# Patient Record
Sex: Male | Born: 1945 | Race: White | Hispanic: No | Marital: Married | Smoking: Current some day smoker
Health system: Southern US, Community
[De-identification: ages and names within clinical notes are randomized; demographics above are authoritative.]

## PROBLEM LIST (undated history)

## (undated) DIAGNOSIS — T4145XA Adverse effect of unspecified anesthetic, initial encounter: Secondary | ICD-10-CM

## (undated) DIAGNOSIS — K219 Gastro-esophageal reflux disease without esophagitis: Secondary | ICD-10-CM

## (undated) DIAGNOSIS — J301 Allergic rhinitis due to pollen: Secondary | ICD-10-CM

## (undated) DIAGNOSIS — I10 Essential (primary) hypertension: Secondary | ICD-10-CM

## (undated) DIAGNOSIS — T8859XA Other complications of anesthesia, initial encounter: Secondary | ICD-10-CM

## (undated) DIAGNOSIS — J302 Other seasonal allergic rhinitis: Secondary | ICD-10-CM

## (undated) DIAGNOSIS — H269 Unspecified cataract: Secondary | ICD-10-CM

## (undated) HISTORY — PX: CYST EXCISION: SHX5701

## (undated) HISTORY — PX: NOSE SURGERY: SHX723

## (undated) HISTORY — DX: Unspecified cataract: H26.9

## (undated) HISTORY — PX: APPENDECTOMY: SHX54

## (undated) HISTORY — DX: Allergic rhinitis due to pollen: J30.1

## (undated) HISTORY — PX: OTHER SURGICAL HISTORY: SHX169

## (undated) HISTORY — PX: CHOLECYSTECTOMY: SHX55

## (undated) HISTORY — PX: EYE SURGERY: SHX253

---

## 2007-04-26 ENCOUNTER — Other Ambulatory Visit: Payer: Self-pay

## 2007-04-27 ENCOUNTER — Inpatient Hospital Stay: Payer: Self-pay | Admitting: Surgery

## 2008-07-10 IMAGING — CT CT ABD-PELV W/ CM
1 of 2 series · 15 of 32 positions shown, 19 images · non-contrast
Comparison: none

REASON FOR EXAM: (1) abd pain; (2) abd pain
COMMENTS:   LMP: (Male)

[Series 2: abdomen · axial · 0.72mm/px · z∈[-108,+332]mm · 15 of 61 slices shown, 19 images]
[im 3/61  soft-tissue]
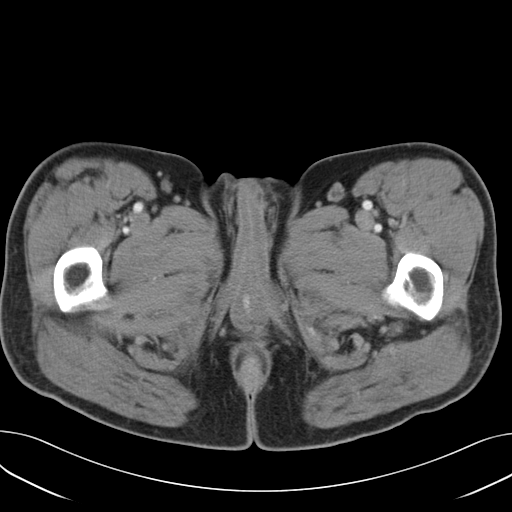
[im 3/61  bone]
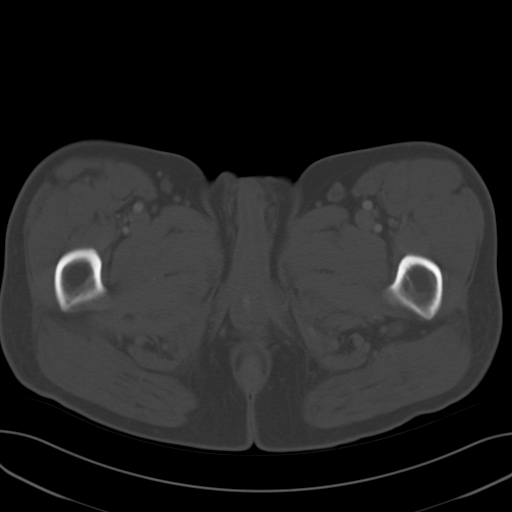
[im 7/61  soft-tissue]
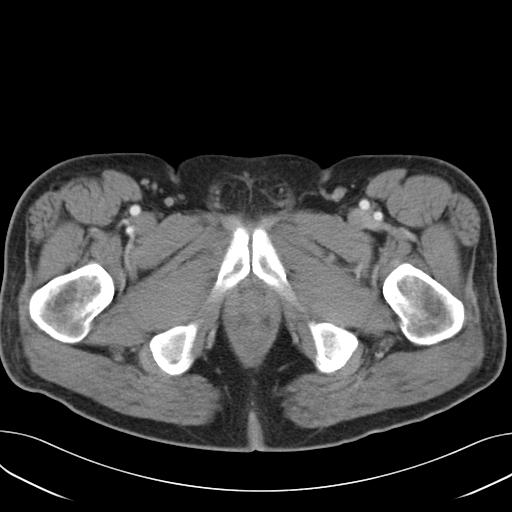
[im 12/61  soft-tissue]
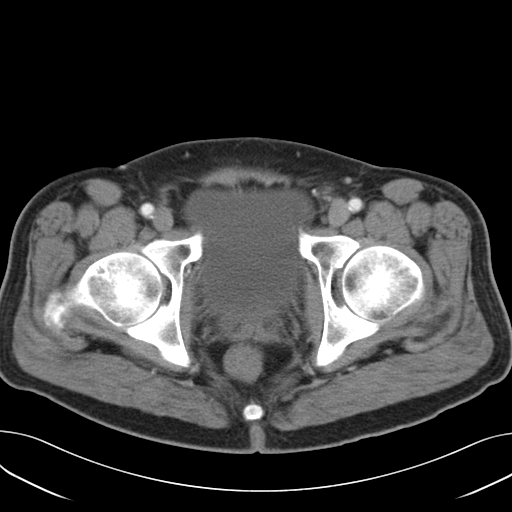
[im 17/61  soft-tissue]
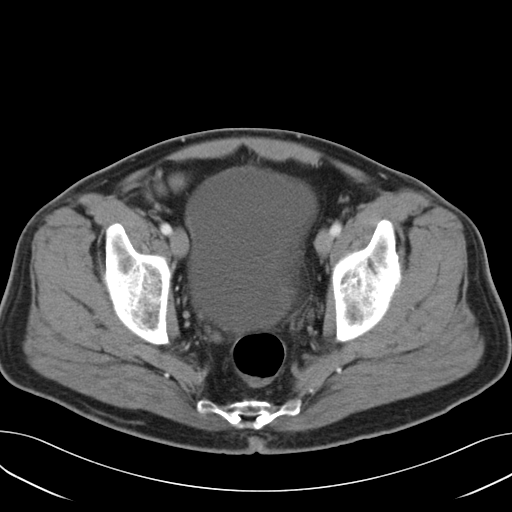
[im 21/61  soft-tissue]
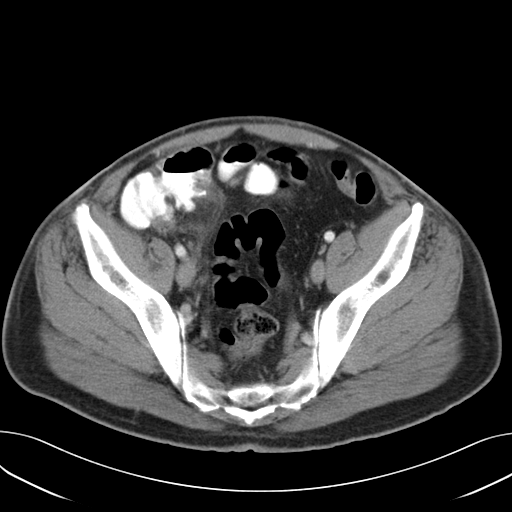
[im 26/61  soft-tissue]
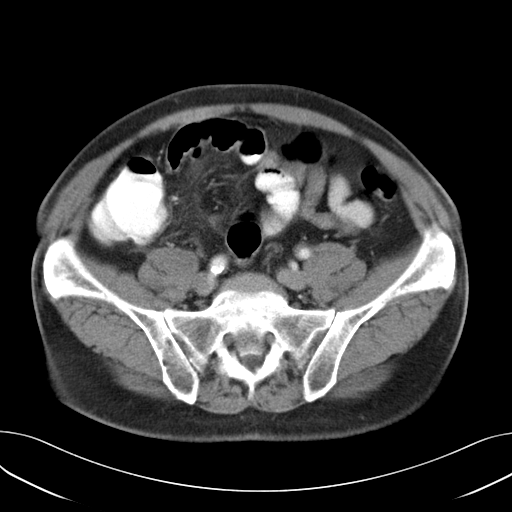
[im 30/61  soft-tissue]
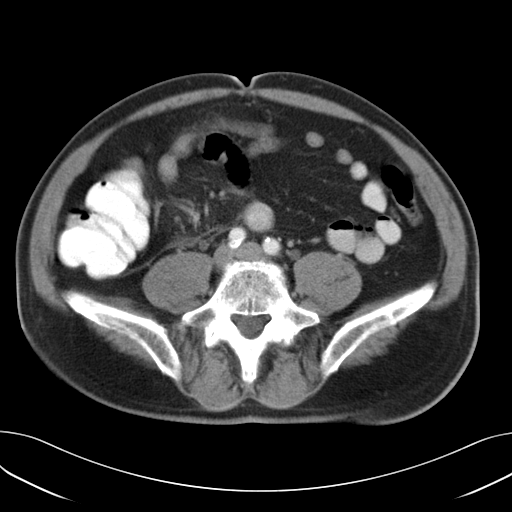
[im 35/61  soft-tissue]
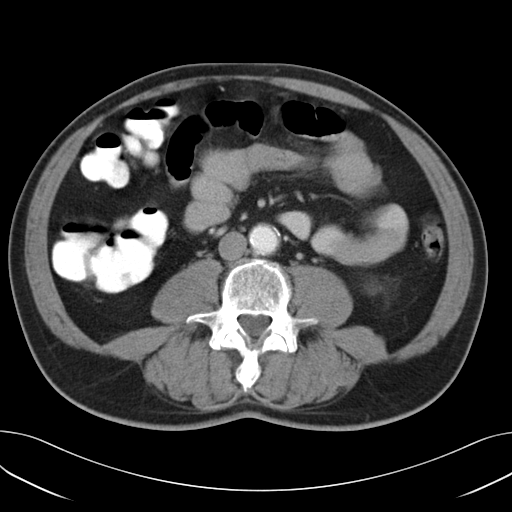
[im 40/61  soft-tissue]
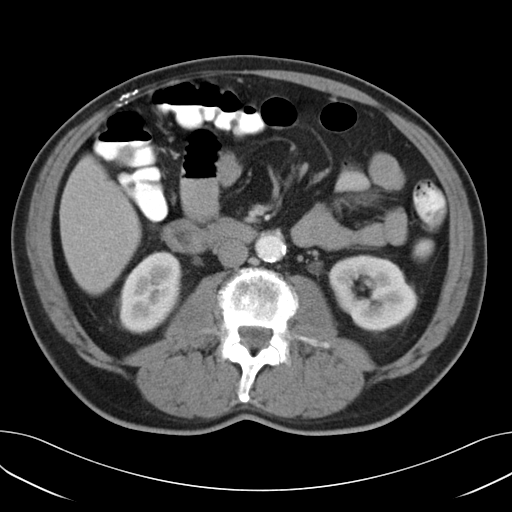
[im 40/61  bone]
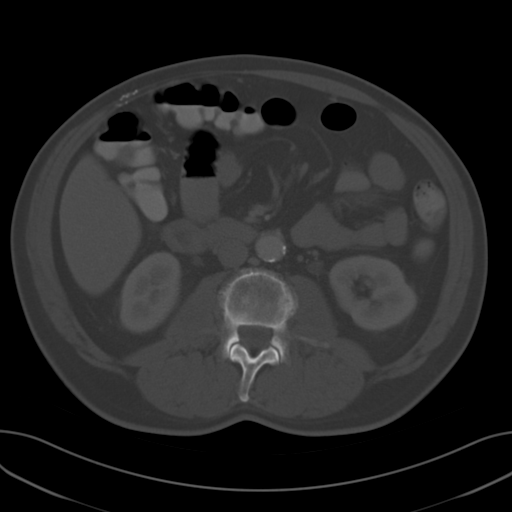
[im 44/61  soft-tissue]
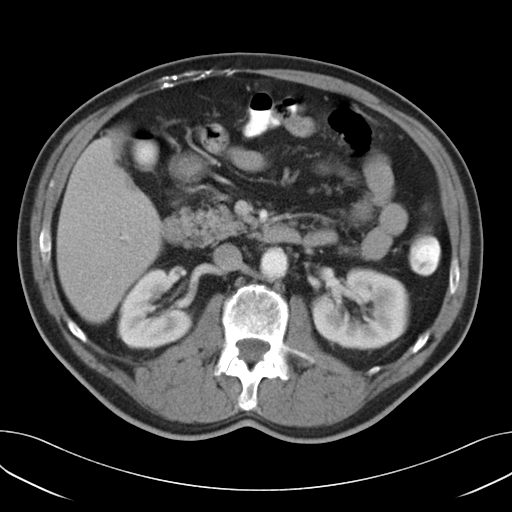
[im 49/61  soft-tissue]
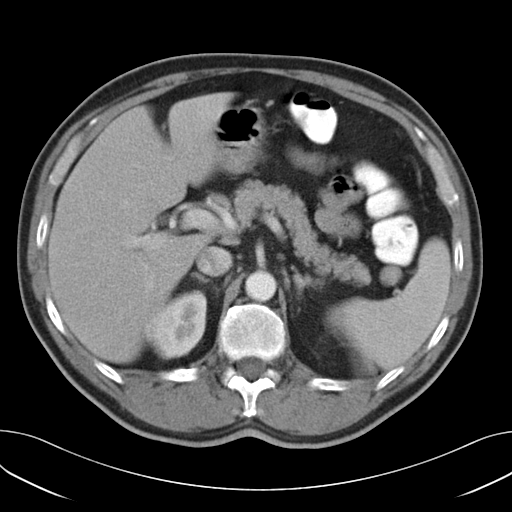
[im 51/61  lung]
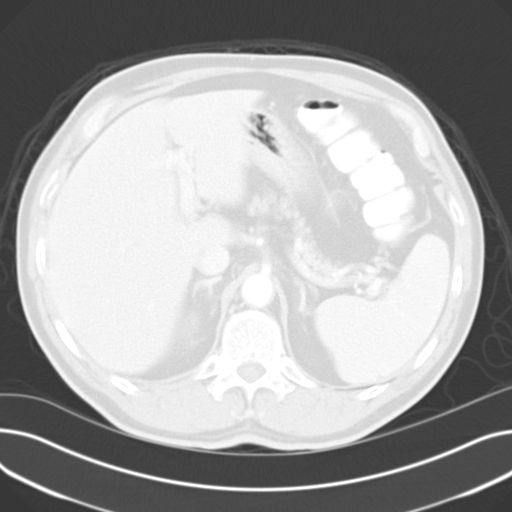
[im 54/61  soft-tissue]
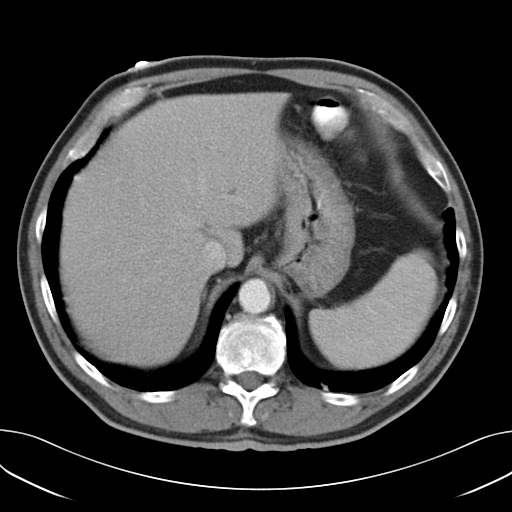
[im 54/61  lung]
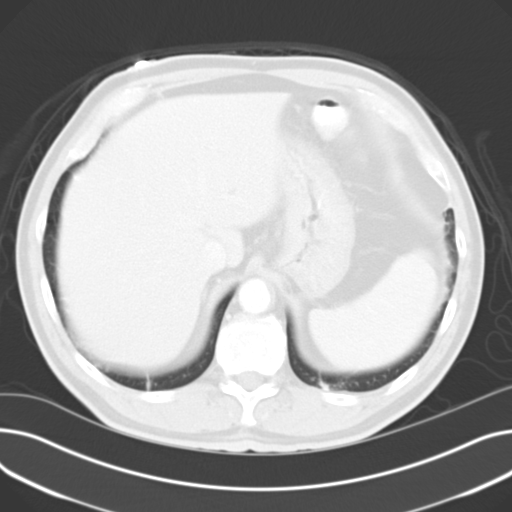
[im 56/61  lung]
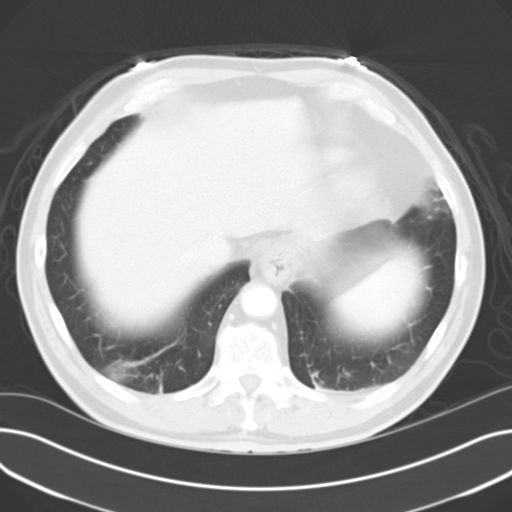
[im 58/61  soft-tissue]
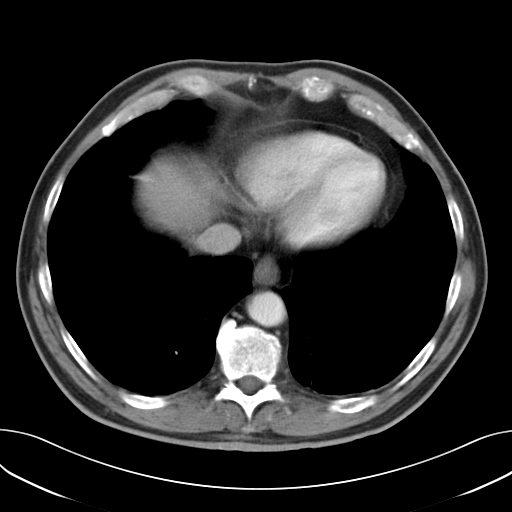
[im 58/61  lung]
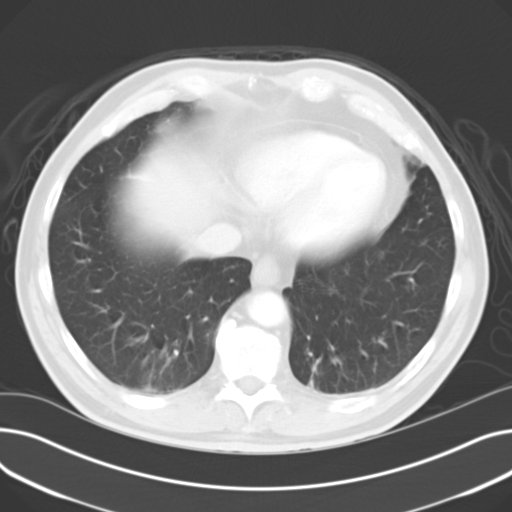

[15 of 32 positions shown; findings below may reference images not displayed]

PROCEDURE:     CT  - CT ABDOMEN / PELVIS  W  - April 26, 2007  [DATE]

RESULT:     Emergent CT of the abdomen and pelvis is performed utilizing 100
ml of Isovue-PK8 and oral contrast. The multislice helical data set is
reconstructed at 8 mm slice thickness. The patient has no prior exam for
comparison.

Study demonstrates significant stranding in the right lower quadrant with an
appearance suggestive of acute appendicitis. Surgical consultation is
recommended. There is no free air. There is no abscess. No significant free
fluid is seen. There is no abnormal bowel distention.

Atherosclerotic calcification is present within the aorta. There is no
aneurysm. The kidneys enhance normally and show no obstruction or mass.
There appears to be a rounded nodular density in the splenic hilar region
best seen on image 14 suggestive of this small accessory splenule. The
gallbladder has been removed. Some air-fluid levels are seen within loops of
small bowel but these are not suggestive of obstruction. Minimal low density
is seen in the left lobe the liver with this is nonspecific and too small
fracture characterization. No intrahepatic ductal dilatation is present. The
adrenal glands are unremarkable. The lung bases demonstrate minimal
atelectasis.
IMPRESSION: 1. Changes consistent with acute appendicitis.
2. Status post cholecystectomy.
3. Some bibasilar areas of atelectasis are present.

## 2009-07-30 ENCOUNTER — Ambulatory Visit: Payer: Self-pay | Admitting: Cardiovascular Disease

## 2009-07-30 ENCOUNTER — Ambulatory Visit: Payer: Self-pay | Admitting: Ophthalmology

## 2009-08-13 ENCOUNTER — Ambulatory Visit: Payer: Self-pay | Admitting: Ophthalmology

## 2010-04-19 ENCOUNTER — Ambulatory Visit: Payer: Self-pay | Admitting: Ophthalmology

## 2010-04-24 ENCOUNTER — Ambulatory Visit: Payer: Self-pay | Admitting: Ophthalmology

## 2010-10-18 ENCOUNTER — Ambulatory Visit: Payer: Self-pay

## 2012-01-02 IMAGING — US ABDOMEN ULTRASOUND LIMITED
1 series · 17 of 25 positions shown · non-contrast
Comparison: none

REASON FOR EXAM: elevated liver enzymes
COMMENTS:

[Series 1: abdomen ultrasound limited · 17 of 58 slices shown]
[im 1/58]
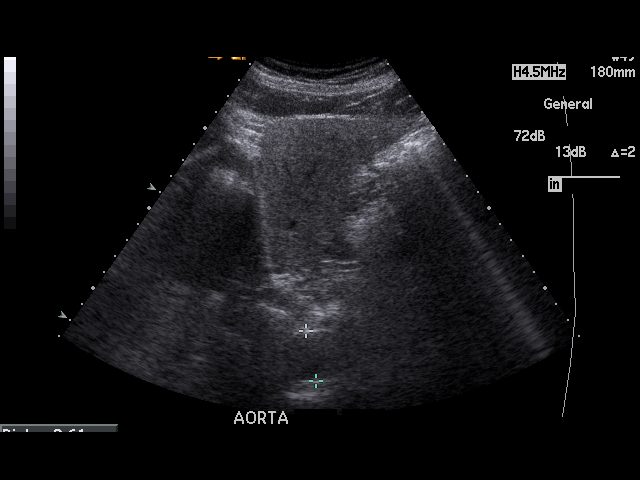
[im 5/58]
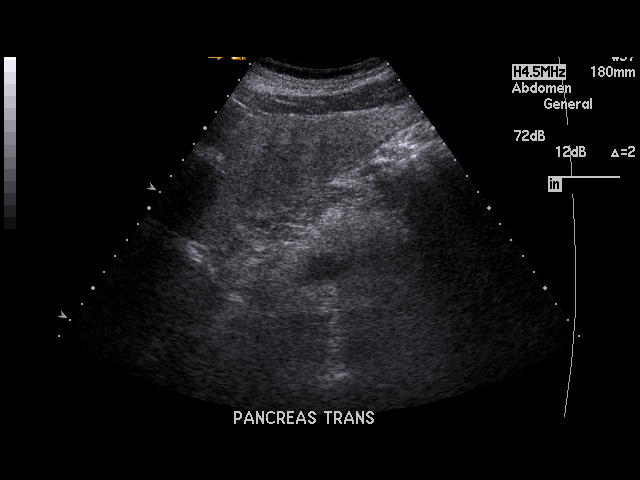
[im 8/58]
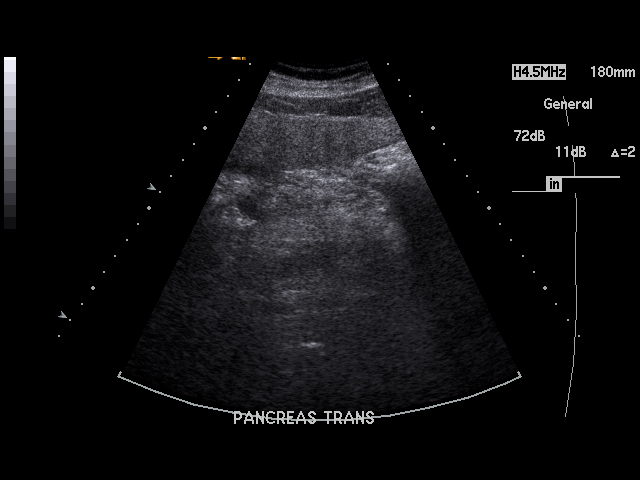
[im 12/58]
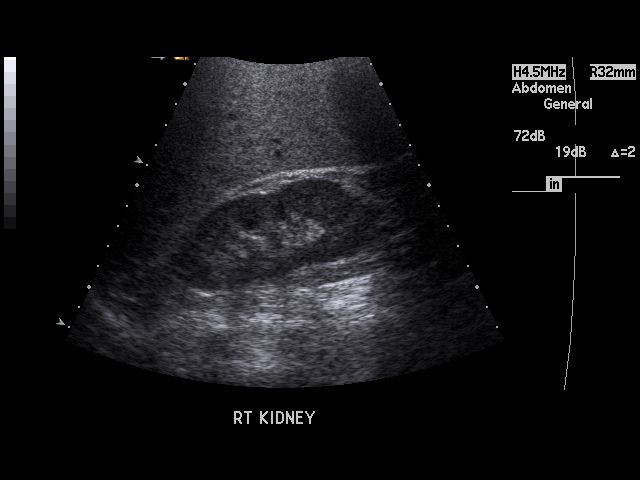
[im 15/58]
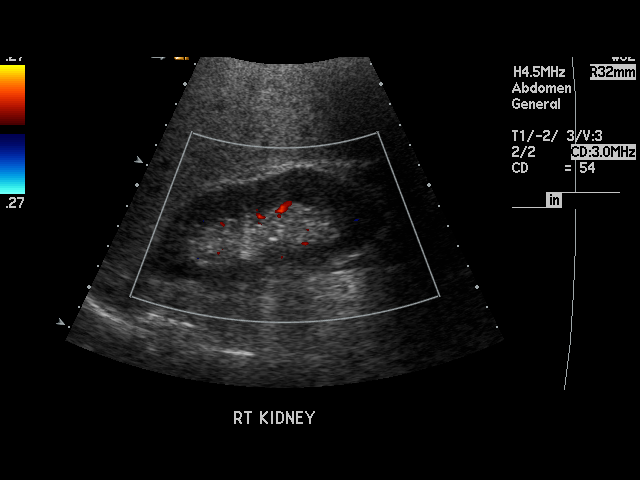
[im 20/58]
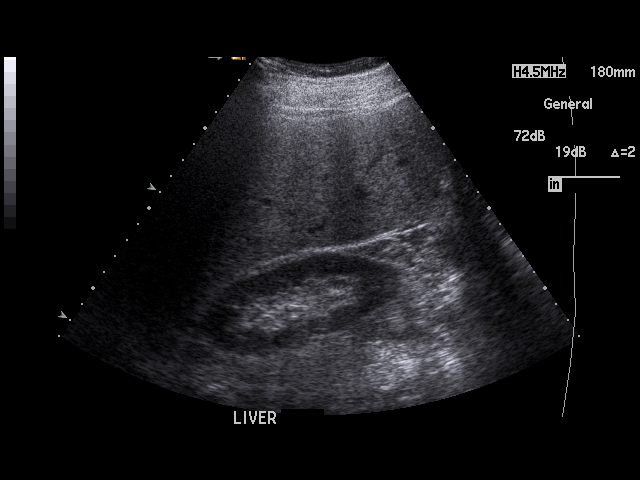
[im 22/58]
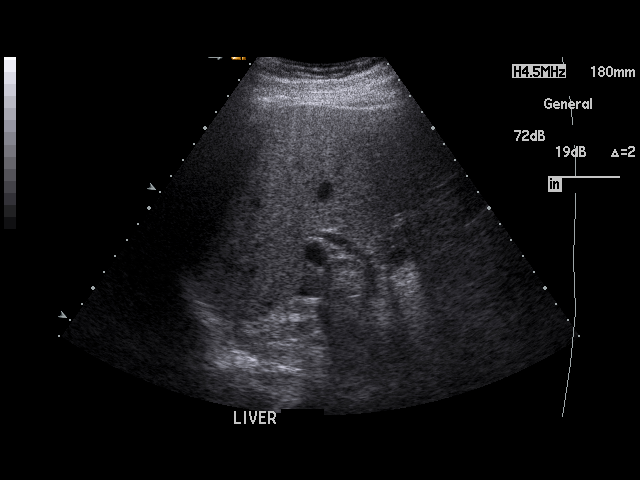
[im 27/58]
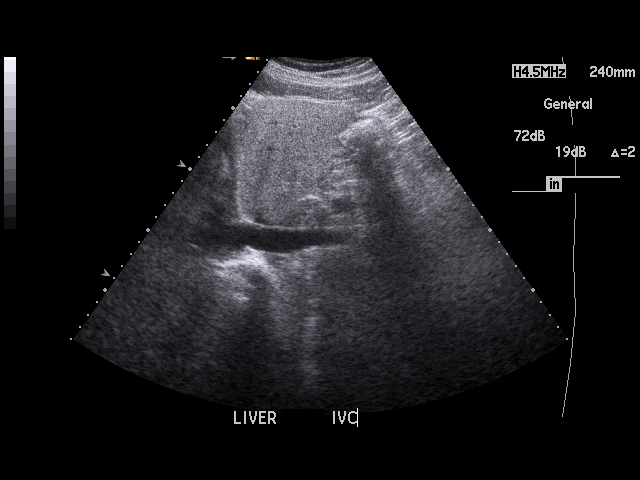
[im 29/58]
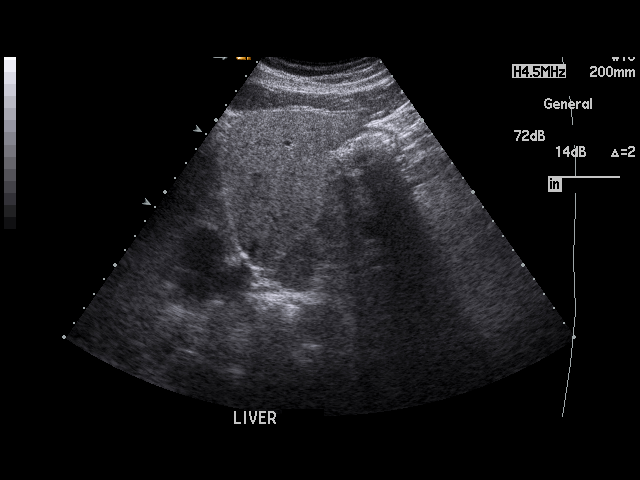
[im 31/58]
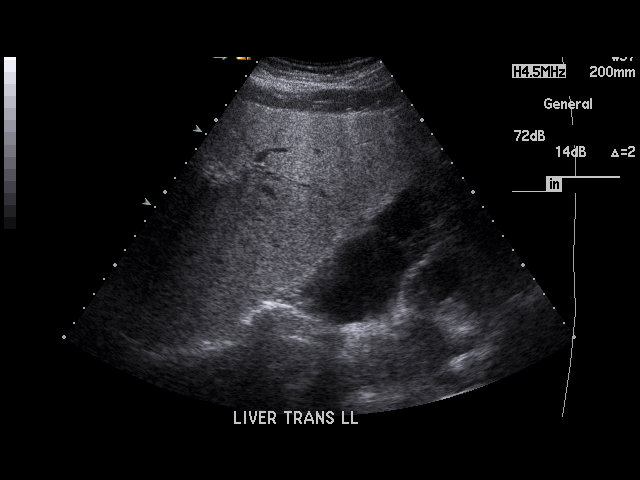
[im 36/58]
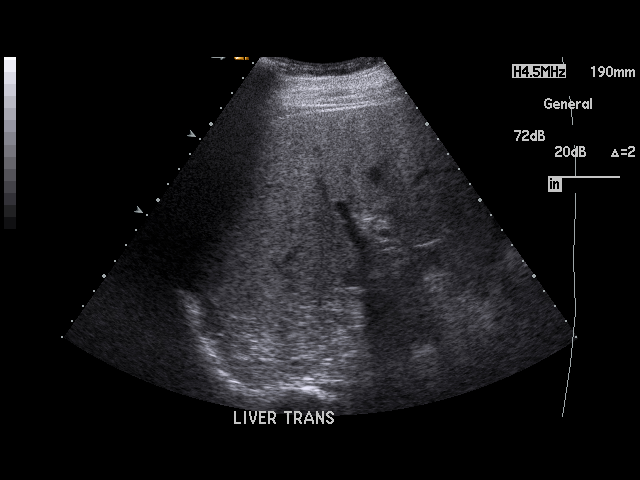
[im 39/58]
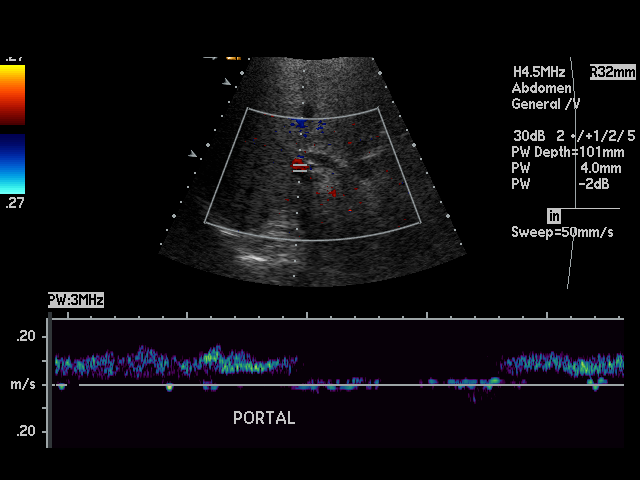
[im 43/58]
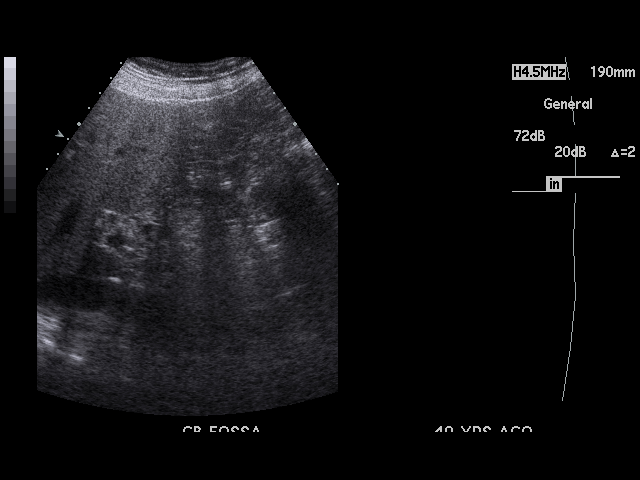
[im 46/58]
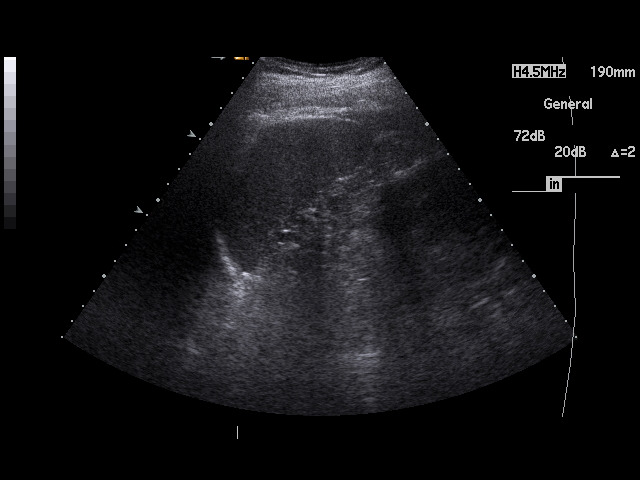
[im 50/58]
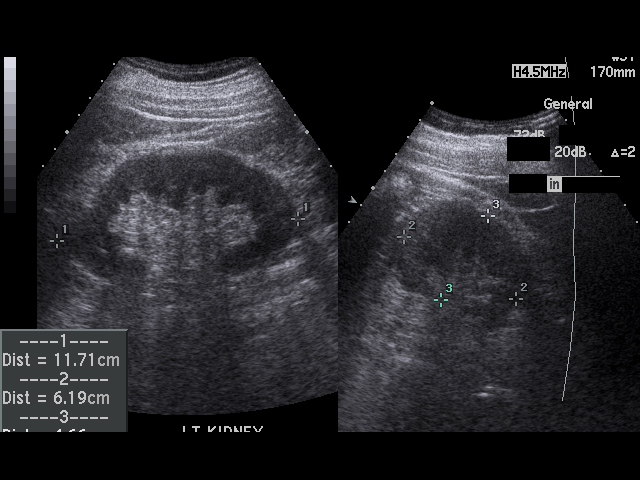
[im 53/58]
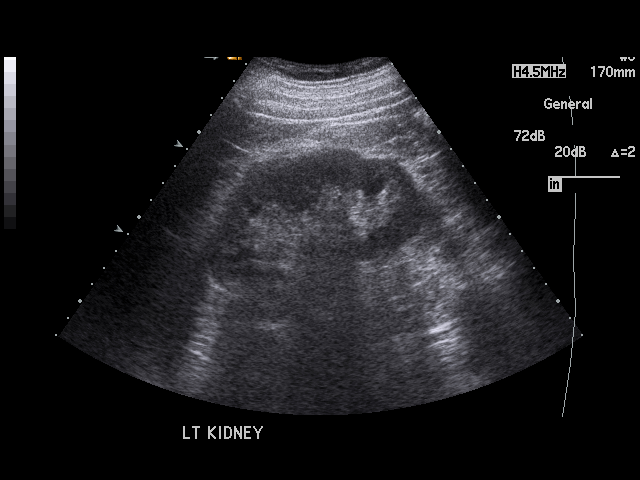
[im 58/58]
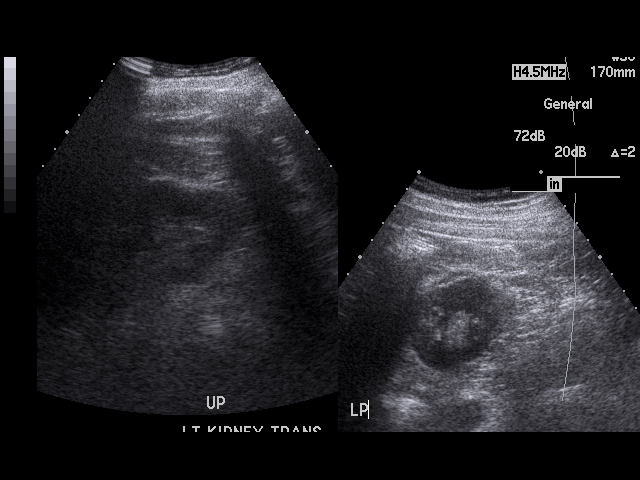

[17 of 25 positions shown; findings below may reference images not displayed]

PROCEDURE:     IFRAH - IFRAH ABDOMEN UPPER GENERAL  - October 18, 2010  [DATE]

RESULT:     The liver is slightly hyperechogenic, suspicious for fatty
infiltration. No focal hepatic mass lesions are seen. The spleen size is
normal. No significant abnormalities of the pancreas, abdominal aorta or
inferior vena cava are observed. The patient is status post cholecystectomy.
The common bile duct measures 6.1 mm in diameter which is within normal
limits. The kidneys show no hydronephrosis. Sagittally, the right kidney
measures 10.75 cm and the left measures 11.71 cm. No ascites is seen.
IMPRESSION: 1.  Probable fatty infiltration of the liver.
2.  The patient is status post cholecystectomy.
3.  No significant abnormalities are identified.

## 2012-03-05 DIAGNOSIS — E559 Vitamin D deficiency, unspecified: Secondary | ICD-10-CM | POA: Diagnosis not present

## 2012-03-05 DIAGNOSIS — I10 Essential (primary) hypertension: Secondary | ICD-10-CM | POA: Diagnosis not present

## 2012-03-05 DIAGNOSIS — E782 Mixed hyperlipidemia: Secondary | ICD-10-CM | POA: Diagnosis not present

## 2012-03-05 DIAGNOSIS — R7301 Impaired fasting glucose: Secondary | ICD-10-CM | POA: Diagnosis not present

## 2012-03-09 DIAGNOSIS — I10 Essential (primary) hypertension: Secondary | ICD-10-CM | POA: Diagnosis not present

## 2012-03-09 DIAGNOSIS — Z0000 Encounter for general adult medical examination without abnormal findings: Secondary | ICD-10-CM | POA: Diagnosis not present

## 2012-03-09 DIAGNOSIS — R7301 Impaired fasting glucose: Secondary | ICD-10-CM | POA: Diagnosis not present

## 2012-03-09 DIAGNOSIS — E559 Vitamin D deficiency, unspecified: Secondary | ICD-10-CM | POA: Diagnosis not present

## 2012-03-09 DIAGNOSIS — E782 Mixed hyperlipidemia: Secondary | ICD-10-CM | POA: Diagnosis not present

## 2012-07-09 DIAGNOSIS — H6120 Impacted cerumen, unspecified ear: Secondary | ICD-10-CM | POA: Diagnosis not present

## 2012-07-09 DIAGNOSIS — K219 Gastro-esophageal reflux disease without esophagitis: Secondary | ICD-10-CM | POA: Diagnosis not present

## 2012-11-17 DIAGNOSIS — H60509 Unspecified acute noninfective otitis externa, unspecified ear: Secondary | ICD-10-CM | POA: Diagnosis not present

## 2012-11-17 DIAGNOSIS — H6120 Impacted cerumen, unspecified ear: Secondary | ICD-10-CM | POA: Diagnosis not present

## 2013-01-07 DIAGNOSIS — K219 Gastro-esophageal reflux disease without esophagitis: Secondary | ICD-10-CM | POA: Diagnosis not present

## 2013-01-07 DIAGNOSIS — R5383 Other fatigue: Secondary | ICD-10-CM | POA: Diagnosis not present

## 2013-01-07 DIAGNOSIS — I10 Essential (primary) hypertension: Secondary | ICD-10-CM | POA: Diagnosis not present

## 2013-01-07 DIAGNOSIS — E782 Mixed hyperlipidemia: Secondary | ICD-10-CM | POA: Diagnosis not present

## 2013-03-14 DIAGNOSIS — Z0000 Encounter for general adult medical examination without abnormal findings: Secondary | ICD-10-CM | POA: Diagnosis not present

## 2013-03-14 DIAGNOSIS — E782 Mixed hyperlipidemia: Secondary | ICD-10-CM | POA: Diagnosis not present

## 2013-03-14 DIAGNOSIS — I10 Essential (primary) hypertension: Secondary | ICD-10-CM | POA: Diagnosis not present

## 2013-05-19 DIAGNOSIS — H6120 Impacted cerumen, unspecified ear: Secondary | ICD-10-CM | POA: Diagnosis not present

## 2013-05-19 DIAGNOSIS — H60509 Unspecified acute noninfective otitis externa, unspecified ear: Secondary | ICD-10-CM | POA: Diagnosis not present

## 2013-06-30 DIAGNOSIS — I10 Essential (primary) hypertension: Secondary | ICD-10-CM | POA: Diagnosis not present

## 2013-06-30 DIAGNOSIS — Z23 Encounter for immunization: Secondary | ICD-10-CM | POA: Diagnosis not present

## 2013-06-30 DIAGNOSIS — F411 Generalized anxiety disorder: Secondary | ICD-10-CM | POA: Diagnosis not present

## 2013-06-30 DIAGNOSIS — F17200 Nicotine dependence, unspecified, uncomplicated: Secondary | ICD-10-CM | POA: Diagnosis not present

## 2013-06-30 DIAGNOSIS — E782 Mixed hyperlipidemia: Secondary | ICD-10-CM | POA: Diagnosis not present

## 2013-06-30 DIAGNOSIS — K219 Gastro-esophageal reflux disease without esophagitis: Secondary | ICD-10-CM | POA: Diagnosis not present

## 2013-11-15 DIAGNOSIS — H6120 Impacted cerumen, unspecified ear: Secondary | ICD-10-CM | POA: Diagnosis not present

## 2013-11-15 DIAGNOSIS — H60509 Unspecified acute noninfective otitis externa, unspecified ear: Secondary | ICD-10-CM | POA: Diagnosis not present

## 2013-11-28 ENCOUNTER — Ambulatory Visit: Payer: Self-pay | Admitting: Anesthesiology

## 2013-11-28 DIAGNOSIS — Z01812 Encounter for preprocedural laboratory examination: Secondary | ICD-10-CM | POA: Diagnosis not present

## 2013-11-28 DIAGNOSIS — Z01810 Encounter for preprocedural cardiovascular examination: Secondary | ICD-10-CM | POA: Diagnosis not present

## 2013-11-28 DIAGNOSIS — I10 Essential (primary) hypertension: Secondary | ICD-10-CM | POA: Diagnosis not present

## 2013-11-28 LAB — BASIC METABOLIC PANEL
Anion Gap: 8 (ref 7–16)
BUN: 12 mg/dL (ref 7–18)
CALCIUM: 8.8 mg/dL (ref 8.5–10.1)
CO2: 23 mmol/L (ref 21–32)
CREATININE: 0.95 mg/dL (ref 0.60–1.30)
Chloride: 104 mmol/L (ref 98–107)
EGFR (African American): >60
EGFR (Non-African Amer.): >60
GLUCOSE: 212 mg/dL — AB (ref 65–99)
Osmolality: 276 (ref 275–301)
Potassium: 4.3 mmol/L (ref 3.5–5.1)
Sodium: 135 mmol/L — ABNORMAL LOW (ref 136–145)

## 2013-12-05 ENCOUNTER — Ambulatory Visit: Payer: Self-pay | Admitting: Otolaryngology

## 2013-12-05 DIAGNOSIS — J301 Allergic rhinitis due to pollen: Secondary | ICD-10-CM | POA: Diagnosis not present

## 2013-12-05 DIAGNOSIS — R222 Localized swelling, mass and lump, trunk: Secondary | ICD-10-CM | POA: Diagnosis not present

## 2013-12-05 DIAGNOSIS — I10 Essential (primary) hypertension: Secondary | ICD-10-CM | POA: Diagnosis not present

## 2013-12-05 DIAGNOSIS — R229 Localized swelling, mass and lump, unspecified: Secondary | ICD-10-CM | POA: Diagnosis not present

## 2013-12-05 DIAGNOSIS — L723 Sebaceous cyst: Secondary | ICD-10-CM | POA: Diagnosis not present

## 2013-12-05 DIAGNOSIS — L820 Inflamed seborrheic keratosis: Secondary | ICD-10-CM | POA: Diagnosis not present

## 2013-12-05 DIAGNOSIS — Z79899 Other long term (current) drug therapy: Secondary | ICD-10-CM | POA: Diagnosis not present

## 2013-12-07 LAB — PATHOLOGY REPORT

## 2013-12-30 DIAGNOSIS — K649 Unspecified hemorrhoids: Secondary | ICD-10-CM | POA: Diagnosis not present

## 2013-12-30 DIAGNOSIS — Z125 Encounter for screening for malignant neoplasm of prostate: Secondary | ICD-10-CM | POA: Diagnosis not present

## 2013-12-30 DIAGNOSIS — Z0000 Encounter for general adult medical examination without abnormal findings: Secondary | ICD-10-CM | POA: Diagnosis not present

## 2013-12-30 DIAGNOSIS — B356 Tinea cruris: Secondary | ICD-10-CM | POA: Diagnosis not present

## 2013-12-30 DIAGNOSIS — I10 Essential (primary) hypertension: Secondary | ICD-10-CM | POA: Diagnosis not present

## 2013-12-30 DIAGNOSIS — F17200 Nicotine dependence, unspecified, uncomplicated: Secondary | ICD-10-CM | POA: Diagnosis not present

## 2013-12-30 DIAGNOSIS — R7301 Impaired fasting glucose: Secondary | ICD-10-CM | POA: Diagnosis not present

## 2013-12-30 DIAGNOSIS — E782 Mixed hyperlipidemia: Secondary | ICD-10-CM | POA: Diagnosis not present

## 2014-03-22 DIAGNOSIS — Z125 Encounter for screening for malignant neoplasm of prostate: Secondary | ICD-10-CM | POA: Diagnosis not present

## 2014-03-22 DIAGNOSIS — E782 Mixed hyperlipidemia: Secondary | ICD-10-CM | POA: Diagnosis not present

## 2014-03-22 DIAGNOSIS — Z0100 Encounter for examination of eyes and vision without abnormal findings: Secondary | ICD-10-CM | POA: Diagnosis not present

## 2014-03-22 DIAGNOSIS — I10 Essential (primary) hypertension: Secondary | ICD-10-CM | POA: Diagnosis not present

## 2014-05-18 DIAGNOSIS — H6122 Impacted cerumen, left ear: Secondary | ICD-10-CM | POA: Diagnosis not present

## 2014-05-18 DIAGNOSIS — H6060 Unspecified chronic otitis externa, unspecified ear: Secondary | ICD-10-CM | POA: Diagnosis not present

## 2014-07-04 DIAGNOSIS — K219 Gastro-esophageal reflux disease without esophagitis: Secondary | ICD-10-CM | POA: Diagnosis not present

## 2014-07-04 DIAGNOSIS — E782 Mixed hyperlipidemia: Secondary | ICD-10-CM | POA: Diagnosis not present

## 2014-07-04 DIAGNOSIS — I10 Essential (primary) hypertension: Secondary | ICD-10-CM | POA: Diagnosis not present

## 2014-07-04 DIAGNOSIS — Z23 Encounter for immunization: Secondary | ICD-10-CM | POA: Diagnosis not present

## 2014-07-04 DIAGNOSIS — F17210 Nicotine dependence, cigarettes, uncomplicated: Secondary | ICD-10-CM | POA: Diagnosis not present

## 2014-07-19 DIAGNOSIS — J00 Acute nasopharyngitis [common cold]: Secondary | ICD-10-CM | POA: Diagnosis not present

## 2014-07-19 DIAGNOSIS — R070 Pain in throat: Secondary | ICD-10-CM | POA: Diagnosis not present

## 2014-11-16 DIAGNOSIS — J387 Other diseases of larynx: Secondary | ICD-10-CM | POA: Diagnosis not present

## 2014-11-16 DIAGNOSIS — H6123 Impacted cerumen, bilateral: Secondary | ICD-10-CM | POA: Diagnosis not present

## 2014-12-09 NOTE — Op Note (Signed)
PATIENT NAME:  Troy Baldwin, GEDNEY MR#:  [id number] DATE OF BIRTH:  November 07, 1945  DATE OF PROCEDURE:  12/05/2013  PREOPERATIVE DIAGNOSES:  1.  Symptomatic left upper eyelid mass.  2.  Left postauricular superior mass.  3.  Left inferior postauricular mass.  4.  Large symptomatic painful left chest wall mass.   PROCEDURE PERFORMED:  1.  Excision of the left upper eyelid lesion with simple closure.  2.  Excision of left posterior superior postauricular mass, 1 cm in size with simple closure.  3.  Excision of left posterior/inferior postauricular mass 1 cm in size with simple closure.  4.  Excision of left chest wall mass with multi-layered closure and advancement of adipose tissue for closure.   SURGEON: Jerene Bears, M.D.   ANESTHESIA: General endotracheal anesthesia.   ESTIMATED BLOOD LOSS: Less than 10 mL.   IV FLUIDS: Please see anesthesia record.   COMPLICATIONS: None.   DRAINS/STENT PLACEMENTS: None.   SPECIMENS: Left upper eyelid, left posterior and superior postauricular mass, left inferior postauricular mass and left chest wall mass sent for permanent pathological evaluation.   INDICATIONS FOR PROCEDURE: The patient is a 69 year old gentleman with a bleeding left upper eyelid lesion impacting his vision, left superior postauricular and inferior postauricular lesions as well as a painful groin and left chest wall mass.   DESCRIPTION OF PROCEDURE: The patient was identified in holding, benefits and risks of the procedure were discussed and consent was reviewed. The patient was taken to the operating room and placed in the supine position. Local anesthesia was employed with IV sedation and the patient's left upper eyelid as well as left postauricular area as well as left chest wall was anesthetized with 1% lidocaine with 1:100,000 epinephrine, 5 mL total. The patient was prepped and draped in sterile fashion. At this time, attention was directed to the patient's left upper eyelid. Under  direct visualization, the patient's left upper eyelid lesion was excised in an elliptical manner. Careful dissection with a 15 blade scalpel was made in the superficial layer. This was closed in a running fashion with 6-0 fast-absorbing plain gut. Attention at this time was directed to the patient's left superior postauricular mass and this was also excised in an elliptical fashion and care in his care was taken to ensure superficial pore of skin was excised and then this was dissected with curved iris scissors until the cyst was removed from the postauricular region and this was closed in a running fashion with 5-0 nylon.  Attention was directed to the patient's left inferior postauricular mass. In a similar fashion, it was excised in an elliptical fashion along the relaxed skin tension line and this was excised with the overlying skin and closed in an interrupted fashion using 5-0 nylon suture. At this time, attention was directed to the patient's large left-sided chest mass that was painful. There is previous scar where he had a cyst drained in the area. The previous scar was incorporated in the elliptical skin incision and a significant amount of scar tissue of the left chest wall mass was encountered. Using a combination of blunt and sharp techniques, this was circumferentially dissected down to fascia overlying the muscle and this was removed in its entirety and passed off the table for permanent pathological examination. Hemostasis was achieved using bipolar electrocautery. The patient's left chest wall cavity was copiously irrigated. Adipose tissue was dissected away from the underlying skin and advanced for filling of the large defect and then this was sutured  in place with 4-0 Vicryl and the subcutaneous tissue was reapproximated in an interrupted fashion using 4-0 Vicryl and then the skin was closed with a running 4-0 nylon. All the incision sites were dressed with Gentak ointment. Care of the patient,  at this time, was transferred to anesthesia.   ____________________________ Jerene Bears, MD ccv:aw D: 12/05/2013 10:56:32 ET T: 12/05/2013 11:13:08 ET JOB#: [id number]  cc: Jerene Bears, MD, <Dictator> Jerene Bears MD ELECTRONICALLY SIGNED 12/19/2013 9:29

## 2015-01-08 DIAGNOSIS — K219 Gastro-esophageal reflux disease without esophagitis: Secondary | ICD-10-CM | POA: Diagnosis not present

## 2015-01-08 DIAGNOSIS — I10 Essential (primary) hypertension: Secondary | ICD-10-CM | POA: Diagnosis not present

## 2015-01-08 DIAGNOSIS — Z0001 Encounter for general adult medical examination with abnormal findings: Secondary | ICD-10-CM | POA: Diagnosis not present

## 2015-01-08 DIAGNOSIS — R300 Dysuria: Secondary | ICD-10-CM | POA: Diagnosis not present

## 2015-01-08 DIAGNOSIS — E782 Mixed hyperlipidemia: Secondary | ICD-10-CM | POA: Diagnosis not present

## 2015-01-08 DIAGNOSIS — F17210 Nicotine dependence, cigarettes, uncomplicated: Secondary | ICD-10-CM | POA: Diagnosis not present

## 2015-04-03 DIAGNOSIS — E782 Mixed hyperlipidemia: Secondary | ICD-10-CM | POA: Diagnosis not present

## 2015-04-03 DIAGNOSIS — I10 Essential (primary) hypertension: Secondary | ICD-10-CM | POA: Diagnosis not present

## 2015-04-03 DIAGNOSIS — Z1159 Encounter for screening for other viral diseases: Secondary | ICD-10-CM | POA: Diagnosis not present

## 2015-04-03 DIAGNOSIS — Z0001 Encounter for general adult medical examination with abnormal findings: Secondary | ICD-10-CM | POA: Diagnosis not present

## 2015-05-18 DIAGNOSIS — H6063 Unspecified chronic otitis externa, bilateral: Secondary | ICD-10-CM | POA: Diagnosis not present

## 2015-05-18 DIAGNOSIS — H6123 Impacted cerumen, bilateral: Secondary | ICD-10-CM | POA: Diagnosis not present

## 2015-07-10 DIAGNOSIS — E782 Mixed hyperlipidemia: Secondary | ICD-10-CM | POA: Diagnosis not present

## 2015-07-10 DIAGNOSIS — F17210 Nicotine dependence, cigarettes, uncomplicated: Secondary | ICD-10-CM | POA: Diagnosis not present

## 2015-07-10 DIAGNOSIS — I10 Essential (primary) hypertension: Secondary | ICD-10-CM | POA: Diagnosis not present

## 2015-07-10 DIAGNOSIS — K219 Gastro-esophageal reflux disease without esophagitis: Secondary | ICD-10-CM | POA: Diagnosis not present

## 2015-07-23 DIAGNOSIS — H4311 Vitreous hemorrhage, right eye: Secondary | ICD-10-CM | POA: Diagnosis not present

## 2015-07-23 DIAGNOSIS — H43811 Vitreous degeneration, right eye: Secondary | ICD-10-CM | POA: Diagnosis not present

## 2015-07-30 DIAGNOSIS — H33011 Retinal detachment with single break, right eye: Secondary | ICD-10-CM | POA: Diagnosis not present

## 2015-08-01 ENCOUNTER — Ambulatory Visit
Admission: RE | Admit: 2015-08-01 | Discharge: 2015-08-01 | Disposition: A | Discharge status: Home / Self Care | Payer: Medicare Other | Source: Ambulatory Visit | Attending: Ophthalmology | Admitting: Ophthalmology

## 2015-08-01 ENCOUNTER — Ambulatory Visit: Payer: Medicare Other | Admitting: *Deleted

## 2015-08-01 ENCOUNTER — Encounter: Payer: Self-pay | Admitting: *Deleted

## 2015-08-01 ENCOUNTER — Encounter
Admission: RE | Disposition: A | Discharge status: Home / Self Care | Payer: Self-pay | Source: Ambulatory Visit | Attending: Ophthalmology

## 2015-08-01 DIAGNOSIS — F17200 Nicotine dependence, unspecified, uncomplicated: Secondary | ICD-10-CM | POA: Insufficient documentation

## 2015-08-01 DIAGNOSIS — H33001 Unspecified retinal detachment with retinal break, right eye: Secondary | ICD-10-CM | POA: Diagnosis not present

## 2015-08-01 DIAGNOSIS — Z79899 Other long term (current) drug therapy: Secondary | ICD-10-CM | POA: Diagnosis not present

## 2015-08-01 DIAGNOSIS — Z9849 Cataract extraction status, unspecified eye: Secondary | ICD-10-CM | POA: Insufficient documentation

## 2015-08-01 DIAGNOSIS — I10 Essential (primary) hypertension: Secondary | ICD-10-CM | POA: Insufficient documentation

## 2015-08-01 DIAGNOSIS — H33021 Retinal detachment with multiple breaks, right eye: Secondary | ICD-10-CM | POA: Diagnosis not present

## 2015-08-01 DIAGNOSIS — E7800 Pure hypercholesterolemia, unspecified: Secondary | ICD-10-CM | POA: Insufficient documentation

## 2015-08-01 DIAGNOSIS — H33011 Retinal detachment with single break, right eye: Secondary | ICD-10-CM | POA: Diagnosis not present

## 2015-08-01 DIAGNOSIS — Z9049 Acquired absence of other specified parts of digestive tract: Secondary | ICD-10-CM | POA: Insufficient documentation

## 2015-08-01 DIAGNOSIS — H3321 Serous retinal detachment, right eye: Secondary | ICD-10-CM | POA: Diagnosis present

## 2015-08-01 HISTORY — DX: Gastro-esophageal reflux disease without esophagitis: K21.9

## 2015-08-01 HISTORY — DX: Essential (primary) hypertension: I10

## 2015-08-01 HISTORY — PX: PARS PLANA VITRECTOMY: SHX2166

## 2015-08-01 SURGERY — PARS PLANA VITRECTOMY WITH 25 GAUGE
Anesthesia: Monitor Anesthesia Care | Laterality: Right

## 2015-08-01 MED ORDER — PHENYLEPHRINE HCL 10 % OP SOLN
1.0000 [drp] | Freq: Once | OPHTHALMIC | Status: AC
  Administered 2015-08-01: 1 [drp] via OPHTHALMIC

## 2015-08-01 MED ORDER — BSS PLUS IO SOLN
INTRAOCULAR | Status: DC | PRN
  Administered 2015-08-01: 1 via INTRAOCULAR

## 2015-08-01 MED ORDER — ATROPINE SULFATE 1 % OP SOLN
OPHTHALMIC | Status: AC
  Filled 2015-08-01: qty 5

## 2015-08-01 MED ORDER — PHENYLEPHRINE HCL 10 % OP SOLN
1.0000 [drp] | OPHTHALMIC | Status: AC
  Administered 2015-08-01 (×2): 1 [drp] via OPHTHALMIC

## 2015-08-01 MED ORDER — DEXAMETHASONE SODIUM PHOSPHATE 10 MG/ML IJ SOLN
INTRAMUSCULAR | Status: DC | PRN
  Administered 2015-08-01: .5 mL

## 2015-08-01 MED ORDER — BSS PLUS IO SOLN
Freq: Once | INTRAOCULAR | Status: DC
  Filled 2015-08-01: qty 500

## 2015-08-01 MED ORDER — BUPIVACAINE HCL (PF) 0.75 % IJ SOLN
INTRAMUSCULAR | Status: AC
  Filled 2015-08-01: qty 10

## 2015-08-01 MED ORDER — CEFUROXIME OPHTHALMIC INJECTION 1 MG/0.1 ML
INJECTION | OPHTHALMIC | Status: DC | PRN
  Administered 2015-08-01: 0.1 mL via INTRACAMERAL

## 2015-08-01 MED ORDER — TRIAMCINOLONE ACETONIDE 40 MG/ML IJ SUSP
INTRAMUSCULAR | Status: AC
  Filled 2015-08-01: qty 1

## 2015-08-01 MED ORDER — CYCLOPENTOLATE HCL 2 % OP SOLN
1.0000 [drp] | OPHTHALMIC | Status: DC | PRN
  Administered 2015-08-01 (×2): 1 [drp] via OPHTHALMIC

## 2015-08-01 MED ORDER — LIDOCAINE HCL (PF) 4 % IJ SOLN
INTRAMUSCULAR | Status: DC | PRN
  Administered 2015-08-01: 6 mL via OPHTHALMIC

## 2015-08-01 MED ORDER — ONDANSETRON HCL 4 MG/2ML IJ SOLN: 4.0000 mg | Freq: Once | INTRAMUSCULAR | Status: DC | PRN

## 2015-08-01 MED ORDER — DEXAMETHASONE SODIUM PHOSPHATE 10 MG/ML IJ SOLN
INTRAMUSCULAR | Status: AC
  Filled 2015-08-01: qty 1

## 2015-08-01 MED ORDER — HYPROMELLOSE 0.3 % OP GEL
OPHTHALMIC | Status: AC
  Filled 2015-08-01: qty 3.5

## 2015-08-01 MED ORDER — MIDAZOLAM HCL 2 MG/2ML IJ SOLN
INTRAMUSCULAR | Status: DC | PRN
  Administered 2015-08-01 (×2): 1 mg via INTRAVENOUS

## 2015-08-01 MED ORDER — CYCLOPENTOLATE HCL 2 % OP SOLN
1.0000 [drp] | OPHTHALMIC | Status: AC | PRN
  Administered 2015-08-01: 1 [drp] via OPHTHALMIC

## 2015-08-01 MED ORDER — TETRACAINE HCL 0.5 % OP SOLN
OPHTHALMIC | Status: DC | PRN
  Administered 2015-08-01: 2 [drp] via OPHTHALMIC

## 2015-08-01 MED ORDER — SODIUM CHLORIDE 0.9 % IV SOLN
INTRAVENOUS | Status: DC
  Administered 2015-08-01 (×2): via INTRAVENOUS

## 2015-08-01 MED ORDER — CYCLOPENTOLATE HCL 2 % OP SOLN
OPHTHALMIC | Status: AC
  Administered 2015-08-01: 1 [drp] via OPHTHALMIC
  Filled 2015-08-01: qty 2

## 2015-08-01 MED ORDER — ALFENTANIL 500 MCG/ML IJ INJ
INJECTION | INTRAMUSCULAR | Status: DC | PRN
  Administered 2015-08-01 (×2): 500 ug via INTRAVENOUS

## 2015-08-01 MED ORDER — NEOMYCIN-POLYMYXIN-DEXAMETH 3.5-10000-0.1 OP OINT
TOPICAL_OINTMENT | OPHTHALMIC | Status: AC
  Filled 2015-08-01: qty 3.5

## 2015-08-01 MED ORDER — HYPROMELLOSE 0.3 % OP GEL
OPHTHALMIC | Status: DC | PRN
  Administered 2015-08-01: 1 via OPHTHALMIC

## 2015-08-01 MED ORDER — PHENYLEPHRINE HCL 10 % OP SOLN
OPHTHALMIC | Status: AC
  Administered 2015-08-01: 1 [drp] via OPHTHALMIC
  Filled 2015-08-01: qty 5

## 2015-08-01 MED ORDER — NEOMYCIN-POLYMYXIN-DEXAMETH 0.1 % OP OINT
TOPICAL_OINTMENT | OPHTHALMIC | Status: DC | PRN
  Administered 2015-08-01: 1 via OPHTHALMIC

## 2015-08-01 MED ORDER — LIDOCAINE HCL (PF) 4 % IJ SOLN
INTRAMUSCULAR | Status: AC
  Filled 2015-08-01: qty 5

## 2015-08-01 MED ORDER — ATROPINE SULFATE 1 % OP SOLN
OPHTHALMIC | Status: DC | PRN
  Administered 2015-08-01: 2 [drp] via OPHTHALMIC

## 2015-08-01 MED ORDER — TETRACAINE HCL 0.5 % OP SOLN
OPHTHALMIC | Status: AC
  Filled 2015-08-01: qty 2

## 2015-08-01 MED ORDER — HYALURONIDASE HUMAN 150 UNIT/ML IJ SOLN
INTRAMUSCULAR | Status: AC
  Filled 2015-08-01: qty 1

## 2015-08-01 MED ORDER — HYDROMORPHONE HCL 1 MG/ML IJ SOLN: 0.2500 mg | INTRAMUSCULAR | Status: DC | PRN

## 2015-08-01 MED ORDER — CEFUROXIME OPHTHALMIC INJECTION 1 MG/0.1 ML
INJECTION | OPHTHALMIC | Status: AC
  Filled 2015-08-01: qty 0.1

## 2015-08-01 SURGICAL SUPPLY — 32 items
APPLICATOR COTTON TIP 6IN STRL (MISCELLANEOUS) ×8 IMPLANT
CANNULA SOFT TIP 25G (CANNULA) IMPLANT
CORD BIP STRL DISP 12FT (MISCELLANEOUS) IMPLANT
CUP MEDICINE 2OZ PLAST GRAD ST (MISCELLANEOUS) ×2 IMPLANT
ERASER HMR WETFIELD 25G (MISCELLANEOUS) ×2 IMPLANT
FILTER MILLEX .045 (MISCELLANEOUS) ×1 IMPLANT
FLTR MILLEX .045 (MISCELLANEOUS) ×2
FORCEPS GRIESH GRASP 25G (INSTRUMENTS) IMPLANT
FORCEPS GRIESH ILM PLUS 25G (INSTRUMENTS) IMPLANT
GLOVE BIO SURGEON STRL SZ8 (GLOVE) ×2 IMPLANT
GLOVE SURG LX 6.5 MICRO (GLOVE) ×1
GLOVE SURG LX STRL 6.5 MICRO (GLOVE) ×1 IMPLANT
GLOVE SURG PR MICRO ENCORE 7 (GLOVE) ×2 IMPLANT
GOWN STRL REUS W/ TWL LRG LVL3 (GOWN DISPOSABLE) ×2 IMPLANT
GOWN STRL REUS W/TWL LRG LVL3 (GOWN DISPOSABLE) ×2
IV SET STOPCOCK EXT 40 2IN (SET/KITS/TRAYS/PACK) ×2 IMPLANT
LENS BIOM OPTIC SET 200MM DISP (MISCELLANEOUS) ×2 IMPLANT
LENS VITRECTOMY FLAT DISP (MISCELLANEOUS) IMPLANT
NDL RETROBULBAR .5 NSTRL (NEEDLE) ×2 IMPLANT
NEEDLE FILTER BLUNT 18X 1/2SAF (NEEDLE) ×1
NEEDLE FILTER BLUNT 18X1 1/2 (NEEDLE) ×1 IMPLANT
PACK EYE AFTER SURG (MISCELLANEOUS) ×2 IMPLANT
PACK VITRECTOMY (MISCELLANEOUS) ×2 IMPLANT
PACK VITRECTOMY CASSETTE 25GA (MISCELLANEOUS) ×2 IMPLANT
PROBE DIRECTIONAL LASER (MISCELLANEOUS) IMPLANT
PROBE LASER ILLUM FLEX CVD 25G (OPHTHALMIC) ×2 IMPLANT
SOL PREP PVP 2OZ (MISCELLANEOUS) ×2
SOLUTION PREP PVP 2OZ (MISCELLANEOUS) ×1 IMPLANT
STRAP SAFETY BODY (MISCELLANEOUS) ×2 IMPLANT
SUT VICRYL 7 0 TG140 8 (SUTURE) ×2 IMPLANT
SYR 50ML LL SCALE MARK (SYRINGE) ×2 IMPLANT
SYRINGE 10CC LL (SYRINGE) ×2 IMPLANT

## 2015-08-01 NOTE — Anesthesia Postprocedure Evaluation (Signed)
Anesthesia Post Note  Patient: Troy Baldwin  Procedure(s) Performed: Procedure(s) (LRB): PARS PLANA VITRECTOMY WITH 25 GAUGE,endolaser,gas exchange (Right)  Patient location during evaluation: PACU Anesthesia Type: MAC Level of consciousness: awake and alert Vital Signs Assessment: post-procedure vital signs reviewed and stable Respiratory status: spontaneous breathing Cardiovascular status: stable Postop Assessment: no headache    Last Vitals:  Filed Vitals:   08/01/15 0615 08/01/15 0909  BP: 151/73 128/65  Pulse: 58 54  Temp: 36.5 C 36.1 C  Resp: 20 18    Last Pain: There were no vitals filed for this visit.               Buckner Malta

## 2015-08-01 NOTE — Discharge Instructions (Signed)
FOLLOW DR. Roderfield'S POSTOP DISCHARGE INSTRUCTION SHEET AS REVIEWED\ ° °AMBULATORY SURGERY  °DISCHARGE INSTRUCTIONS ° ° °1) The drugs that you were given will stay in your system until tomorrow so for the next 24 hours you should not: ° °A) Drive an automobile °B) Make any legal decisions °C) Drink any alcoholic beverage ° ° °2) You may resume regular meals tomorrow.  Today it is better to start with liquids and gradually work up to solid foods. ° °You may eat anything you prefer, but it is better to start with liquids, then soup and crackers, and gradually work up to solid foods. ° ° °3) Please notify your doctor immediately if you have any unusual bleeding, trouble breathing, redness and pain at the surgery site, drainage, fever, or pain not relieved by medication. ° ° ° °4) Additional Instructions: ° ° ° ° ° ° ° °Please contact your physician with any problems or Same Day Surgery at [phone], Monday through Friday 6 am to 4 pm, or [place] [address] [place] [facility] number at [phone]. °

## 2015-08-01 NOTE — Anesthesia Postprocedure Evaluation (Signed)
Anesthesia Post Note  Patient: Pranay Panas  Procedure(s) Performed: Procedure(s) (LRB): PARS PLANA VITRECTOMY WITH 25 GAUGE,endolaser,gas exchange (Right)  Patient location during evaluation: Short Stay Anesthesia Type: MAC Level of consciousness: awake and alert Pain management: pain level controlled Vital Signs Assessment: post-procedure vital signs reviewed and stable Respiratory status: spontaneous breathing Cardiovascular status: blood pressure returned to baseline Postop Assessment: no headache Anesthetic complications: no    Last Vitals:  Filed Vitals:   08/01/15 0909 08/01/15 0917  BP: 128/65 115/67  Pulse: 54 50  Temp: 36.1 C   Resp: 18 18    Last Pain: There were no vitals filed for this visit.               Mellony Danziger M

## 2015-08-01 NOTE — Anesthesia Procedure Notes (Signed)
Date/Time: 08/01/2015 7:45 AM Performed by: Allean Found Pre-anesthesia Checklist: Patient identified, Emergency Drugs available, Suction available, Patient being monitored and Timeout performed Patient Re-evaluated:Patient Re-evaluated prior to inductionOxygen Delivery Method: Nasal cannula Intubation Type: IV induction Placement Confirmation: positive ETCO2 Comments: [place] MAC

## 2015-08-01 NOTE — H&P (Signed)
.  Previous H&P scanned in reviewed, patient examined, and no interval changes.  Please see scanned record for complete information.   

## 2015-08-01 NOTE — Transfer of Care (Signed)
Immediate Anesthesia Transfer of Care Note  Patient: Troy Baldwin  Procedure(s) Performed: Procedure(s) with comments: PARS PLANA VITRECTOMY WITH 25 GAUGE,endolaser,gas exchange (Right) - casette lot [id number]      28% SF6  Patient Location: PACU       Anesthesia Type:MAC   Level of Consciousness: awake   Airway & Oxygen Therapy: Patient Spontanous Breathing  Post-op Assessment: Report given to RN and Post -op Vital signs reviewed and stable  Post vital signs: Reviewed and stable  Last Vitals:  Filed Vitals:   08/01/15 0615 08/01/15 0909  BP: 151/73 128/65  Pulse: 58 54  Temp: 36.5 C 36.1 C  Resp: 20 18    Complications: No apparent anesthesia complications

## 2015-08-01 NOTE — Anesthesia Preprocedure Evaluation (Signed)
Anesthesia Evaluation  Patient identified by MRN, date of birth, ID band Patient awake    Reviewed: Allergy & Precautions, NPO status , Patient's Chart, lab work & pertinent test results  Airway Mallampati: III  TM Distance: >3 FB Neck ROM: Limited    Dental  (+) Teeth Intact   Pulmonary Current Smoker,    Pulmonary exam normal        Cardiovascular Exercise Tolerance: Good hypertension, Pt. on medications and Pt. on home beta blockers Normal cardiovascular exam     Neuro/Psych    GI/Hepatic GERD  Medicated and Controlled,  Endo/Other    Renal/GU      Musculoskeletal   Abdominal Normal abdominal exam  (+)   Peds  Hematology   Anesthesia Other Findings   Reproductive/Obstetrics                             Anesthesia Physical Anesthesia Plan  ASA: II  Anesthesia Plan: MAC   Post-op Pain Management:    Induction: Intravenous  Airway Management Planned: Nasal Cannula  Additional Equipment:   Intra-op Plan:   Post-operative Plan:   Informed Consent: I have reviewed the patients History and Physical, chart, labs and discussed the procedure including the risks, benefits and alternatives for the proposed anesthesia with the patient or authorized representative who has indicated his/her understanding and acceptance.     Plan Discussed with: CRNA  Anesthesia Plan Comments: (MAC plus block by surgeon.)        Anesthesia Quick Evaluation

## 2015-08-01 NOTE — Op Note (Signed)
.  INDICATIONS & JUSTIFICATIONS FOR SURGERY: Macula on retinal detachment right eye   PREOPERATIVE DIAGNOSIS: Retinal detachment, right eye.             POST OPERATIVE DIAGNOSIS: Retinal detachment, right eye.                       OPERATION PERFORMED: 25-gauge pars plana vitrectomy, air fluid exchange, drain retinotomy with endolaser and 28% SF6 left eye.                     ANESTHESIA: MAC with peribulbar block.   COMPLICATIONS: None.     BLOOD LOSS: Minimal.   SPECIMEN: None.   DESCRIPTION OF PROCEDURE: Patient was evaluated in the clinic for a macula on retinal detachment right eye.  Risks, benefits, alternatives and complications were discussed, and patient elected to proceed with PPV retinal detachment repair.  On the day of surgery, the patient was greeted in the preoperative holding area.  All questions were answered and the right eye was marked.  The patient was then taken into the operating room in the supine position.    Monitored anesthesia care was administered and Next, 5 ml of a retrobulbar block consisting of 4% xylocaine plain, 0.75% sensorcaine plain and hylenex 100u was injected.  The right eye was then prepped and draped in the usual sterile fashion.  Three 25-gauge trocars were placed in the usual positions inferotemporal, superotemporal and superonasal. The infusion cannula was checked to make sure it was in the vitreous cavity prior to starting the infusion. A core vitrectomy was then performed.  There was significant vitreous debris. Once the core gel was removed a careful shave of the peripheral skirt for 360 degrees took place.  A scleral depressed exam revealed three breaks in the detached retina, one adjacent to the chorioretinal scar superiorly and a few suspicious areas inferonasally.  These breaks were marked, along with the suspicious areas.  A drain retinotomy was then created in the meridian of the temporal break.  A fluid-fluid exchange was performed to remove the subretinal  fluid.  Then air infusion took place and the remaining subretinal fluid was drained through both the original break and the drain retinotomy.  Twenty Eight percent SF6 was infused 4 times the vitreous volume and the trocars were removed and sutured.  All were airtight. The pressure was acceptable by palpation.  Sub conjunctival cefuroxime and dexamethasone were injected and the eye was patched with neo-poly-dex ointment and shielded. The patient was taken to the recovery area in stable condition.

## 2015-08-02 NOTE — H&P (Addendum)
See day of update

## 2015-08-28 DIAGNOSIS — H43391 Other vitreous opacities, right eye: Secondary | ICD-10-CM | POA: Diagnosis not present

## 2015-10-26 DIAGNOSIS — H3321 Serous retinal detachment, right eye: Secondary | ICD-10-CM | POA: Diagnosis not present

## 2015-11-26 DIAGNOSIS — H35371 Puckering of macula, right eye: Secondary | ICD-10-CM | POA: Diagnosis not present

## 2015-12-17 DIAGNOSIS — H35371 Puckering of macula, right eye: Secondary | ICD-10-CM | POA: Diagnosis not present

## 2015-12-27 ENCOUNTER — Encounter: Payer: Self-pay | Admitting: *Deleted

## 2016-01-02 ENCOUNTER — Encounter
Admission: RE | Disposition: A | Discharge status: Home / Self Care | Payer: Self-pay | Source: Ambulatory Visit | Attending: Ophthalmology

## 2016-01-02 ENCOUNTER — Ambulatory Visit: Payer: Medicare Other | Admitting: Anesthesiology

## 2016-01-02 ENCOUNTER — Encounter: Payer: Self-pay | Admitting: *Deleted

## 2016-01-02 ENCOUNTER — Ambulatory Visit
Admission: RE | Admit: 2016-01-02 | Discharge: 2016-01-02 | Disposition: A | Discharge status: Home / Self Care | Payer: Medicare Other | Source: Ambulatory Visit | Attending: Ophthalmology | Admitting: Ophthalmology

## 2016-01-02 DIAGNOSIS — H3341 Traction detachment of retina, right eye: Secondary | ICD-10-CM | POA: Diagnosis not present

## 2016-01-02 DIAGNOSIS — E7800 Pure hypercholesterolemia, unspecified: Secondary | ICD-10-CM | POA: Insufficient documentation

## 2016-01-02 DIAGNOSIS — H43311 Vitreous membranes and strands, right eye: Secondary | ICD-10-CM | POA: Insufficient documentation

## 2016-01-02 DIAGNOSIS — H35371 Puckering of macula, right eye: Secondary | ICD-10-CM | POA: Diagnosis not present

## 2016-01-02 DIAGNOSIS — I10 Essential (primary) hypertension: Secondary | ICD-10-CM | POA: Insufficient documentation

## 2016-01-02 DIAGNOSIS — F17200 Nicotine dependence, unspecified, uncomplicated: Secondary | ICD-10-CM | POA: Diagnosis not present

## 2016-01-02 DIAGNOSIS — Z9049 Acquired absence of other specified parts of digestive tract: Secondary | ICD-10-CM | POA: Insufficient documentation

## 2016-01-02 DIAGNOSIS — Z9849 Cataract extraction status, unspecified eye: Secondary | ICD-10-CM | POA: Diagnosis not present

## 2016-01-02 DIAGNOSIS — K219 Gastro-esophageal reflux disease without esophagitis: Secondary | ICD-10-CM | POA: Diagnosis not present

## 2016-01-02 DIAGNOSIS — H3321 Serous retinal detachment, right eye: Secondary | ICD-10-CM | POA: Insufficient documentation

## 2016-01-02 HISTORY — DX: Adverse effect of unspecified anesthetic, initial encounter: T41.45XA

## 2016-01-02 HISTORY — DX: Other seasonal allergic rhinitis: J30.2

## 2016-01-02 HISTORY — PX: PARS PLANA VITRECTOMY: SHX2166

## 2016-01-02 HISTORY — PX: REPAIR OF COMPLEX TRACTION RETINAL DETACHMENT: SHX6217

## 2016-01-02 HISTORY — PX: MEMBRANE PEEL: SHX5967

## 2016-01-02 HISTORY — DX: Other complications of anesthesia, initial encounter: T88.59XA

## 2016-01-02 SURGERY — PARS PLANA VITRECTOMY WITH 25 GAUGE
Anesthesia: Monitor Anesthesia Care | Laterality: Right

## 2016-01-02 MED ORDER — TRIAMCINOLONE ACETONIDE INTRAVITREAL INJECTION 4 MG/0.1 ML
INTRAOCULAR | Status: DC | PRN
  Administered 2016-01-02: .25 mg via INTRAVITREAL

## 2016-01-02 MED ORDER — ALFENTANIL 500 MCG/ML IJ INJ
INJECTION | INTRAMUSCULAR | Status: DC | PRN
  Administered 2016-01-02: 500 ug via INTRAVENOUS

## 2016-01-02 MED ORDER — DEXTROSE 5 % IV SOLN
25.0000 mg | INTRAVENOUS | Status: DC | PRN
  Administered 2016-01-02: 25 mg via INTRAVENOUS

## 2016-01-02 MED ORDER — BUPIVACAINE HCL (PF) 0.75 % IJ SOLN
INTRAMUSCULAR | Status: AC
  Filled 2016-01-02: qty 10

## 2016-01-02 MED ORDER — HYPROMELLOSE 0.3 % OP GEL
OPHTHALMIC | Status: DC | PRN
  Administered 2016-01-02: 1 via OPHTHALMIC

## 2016-01-02 MED ORDER — HYALURONIDASE HUMAN 150 UNIT/ML IJ SOLN
INTRAMUSCULAR | Status: AC
  Filled 2016-01-02: qty 1

## 2016-01-02 MED ORDER — SODIUM CHLORIDE 0.9 % IV SOLN
INTRAVENOUS | Status: DC
  Administered 2016-01-02: 10:00:00 via INTRAVENOUS

## 2016-01-02 MED ORDER — CEFUROXIME OPHTHALMIC INJECTION 1 MG/0.1 ML
INJECTION | OPHTHALMIC | Status: AC
  Filled 2016-01-02: qty 0.1

## 2016-01-02 MED ORDER — TETRACAINE HCL 0.5 % OP SOLN
OPHTHALMIC | Status: AC
  Filled 2016-01-02: qty 2

## 2016-01-02 MED ORDER — TETRACAINE HCL 0.5 % OP SOLN
OPHTHALMIC | Status: DC | PRN
  Administered 2016-01-02: 2 [drp] via OPHTHALMIC

## 2016-01-02 MED ORDER — MIDAZOLAM HCL 2 MG/2ML IJ SOLN
INTRAMUSCULAR | Status: DC | PRN
  Administered 2016-01-02 (×2): 1 mg via INTRAVENOUS

## 2016-01-02 MED ORDER — LIDOCAINE HCL (PF) 4 % IJ SOLN
INTRAMUSCULAR | Status: AC
  Filled 2016-01-02: qty 5

## 2016-01-02 MED ORDER — ATROPINE SULFATE 1 % OP SOLN
OPHTHALMIC | Status: AC
  Filled 2016-01-02: qty 2

## 2016-01-02 MED ORDER — NEOMYCIN-POLYMYXIN-DEXAMETH 3.5-10000-0.1 OP OINT
TOPICAL_OINTMENT | OPHTHALMIC | Status: AC
  Filled 2016-01-02: qty 3.5

## 2016-01-02 MED ORDER — HYPROMELLOSE 0.3 % OP GEL
OPHTHALMIC | Status: AC
  Filled 2016-01-02: qty 3.5

## 2016-01-02 MED ORDER — DEXAMETHASONE SODIUM PHOSPHATE 10 MG/ML IJ SOLN
INTRAMUSCULAR | Status: AC
  Filled 2016-01-02: qty 1

## 2016-01-02 MED ORDER — ATROPINE SULFATE 1 % OP SOLN
OPHTHALMIC | Status: DC | PRN
  Administered 2016-01-02: 2 [drp] via OPHTHALMIC

## 2016-01-02 MED ORDER — TRIAMCINOLONE ACETONIDE 40 MG/ML IJ SUSP
INTRAMUSCULAR | Status: AC
  Filled 2016-01-02: qty 1

## 2016-01-02 MED ORDER — INDOCYANINE GREEN 25 MG IV SOLR
25.0000 mg | Freq: Once | INTRAVENOUS | Status: DC
  Filled 2016-01-02: qty 25

## 2016-01-02 MED ORDER — PHENYLEPHRINE HCL 10 % OP SOLN
OPHTHALMIC | Status: AC
  Administered 2016-01-02: 1 [drp] via OPHTHALMIC
  Filled 2016-01-02: qty 5

## 2016-01-02 MED ORDER — DEXAMETHASONE SODIUM PHOSPHATE 10 MG/ML IJ SOLN
INTRAMUSCULAR | Status: DC | PRN
  Administered 2016-01-02: .2 mL

## 2016-01-02 MED ORDER — CYCLOPENTOLATE HCL 2 % OP SOLN
OPHTHALMIC | Status: AC
  Administered 2016-01-02: 1 [drp] via OPHTHALMIC
  Filled 2016-01-02: qty 2

## 2016-01-02 MED ORDER — NEOMYCIN-POLYMYXIN-DEXAMETH 0.1 % OP OINT
TOPICAL_OINTMENT | OPHTHALMIC | Status: DC | PRN
  Administered 2016-01-02: 1 via OPHTHALMIC

## 2016-01-02 MED ORDER — CEFUROXIME OPHTHALMIC INJECTION 1 MG/0.1 ML
INJECTION | OPHTHALMIC | Status: DC | PRN
  Administered 2016-01-02: 0.1 mL via INTRACAMERAL

## 2016-01-02 MED ORDER — LIDOCAINE HCL (PF) 4 % IJ SOLN
INTRAMUSCULAR | Status: DC | PRN
  Administered 2016-01-02: 5 mL

## 2016-01-02 MED ORDER — BSS PLUS IO SOLN
INTRAOCULAR | Status: DC | PRN
  Administered 2016-01-02: 1 via INTRAOCULAR

## 2016-01-02 MED ORDER — CYCLOPENTOLATE HCL 2 % OP SOLN
1.0000 [drp] | OPHTHALMIC | Status: AC
  Administered 2016-01-02 (×2): 1 [drp] via OPHTHALMIC

## 2016-01-02 MED ORDER — BSS PLUS IO SOLN
Freq: Once | INTRAOCULAR | Status: DC
  Filled 2016-01-02: qty 500

## 2016-01-02 MED ORDER — PHENYLEPHRINE HCL 10 % OP SOLN
1.0000 [drp] | OPHTHALMIC | Status: AC
  Administered 2016-01-02 (×3): 1 [drp] via OPHTHALMIC

## 2016-01-02 SURGICAL SUPPLY — 34 items
APPLICATOR COTTON TIP 6IN STRL (MISCELLANEOUS) ×12 IMPLANT
CANNULA SOFT TIP 25G (CANNULA) IMPLANT
CORD BIP STRL DISP 12FT (MISCELLANEOUS) ×3 IMPLANT
CUP MEDICINE 2OZ PLAST GRAD ST (MISCELLANEOUS) ×3 IMPLANT
ERASER HMR WETFIELD 25G (MISCELLANEOUS) IMPLANT
FILTER MILLEX .045 (MISCELLANEOUS) ×1 IMPLANT
FLTR MILLEX .045 (MISCELLANEOUS) ×3
FORCEPS GRIESH GRASP 25G (INSTRUMENTS) IMPLANT
FORCEPS GRIESH ILM PLUS 25G (INSTRUMENTS) IMPLANT
GLOVE BIO SURGEON STRL SZ8 (GLOVE) ×3 IMPLANT
GLOVE SURG LX 6.5 MICRO (GLOVE) ×2
GLOVE SURG LX STRL 6.5 MICRO (GLOVE) ×1 IMPLANT
GLOVE SURG PR MICRO ENCORE 7 (GLOVE) ×3 IMPLANT
GOWN STRL REUS W/ TWL LRG LVL3 (GOWN DISPOSABLE) ×2 IMPLANT
GOWN STRL REUS W/TWL LRG LVL3 (GOWN DISPOSABLE) ×4
IV SET STOPCOCK EXT 40 2IN (SET/KITS/TRAYS/PACK) ×3 IMPLANT
LENS BIOM OPTIC SET 200MM DISP (MISCELLANEOUS) ×3 IMPLANT
LENS VITRECTOMY FLAT DISP (MISCELLANEOUS) IMPLANT
NDL RETROBULBAR .5 NSTRL (NEEDLE) ×3 IMPLANT
NEEDLE FILTER BLUNT 18X 1/2SAF (NEEDLE) ×2
NEEDLE FILTER BLUNT 18X1 1/2 (NEEDLE) ×1 IMPLANT
PACK EYE AFTER SURG (MISCELLANEOUS) ×3 IMPLANT
PACK VITRECTOMY (MISCELLANEOUS) ×3 IMPLANT
PACK VITRECTOMY CASSETTE 25GA (MISCELLANEOUS) ×3 IMPLANT
PROBE DIRECTIONAL LASER (MISCELLANEOUS) IMPLANT
PROBE LASER ILLUM FLEX CVD 25G (OPHTHALMIC) IMPLANT
SOL ANTI-FOG 6CC FOG-OUT (MISCELLANEOUS) ×1 IMPLANT
SOL FOG-OUT ANTI-FOG 6CC (MISCELLANEOUS) ×2
SOL PREP PVP 2OZ (MISCELLANEOUS) ×3
SOLUTION PREP PVP 2OZ (MISCELLANEOUS) ×1 IMPLANT
STRAP SAFETY BODY (MISCELLANEOUS) ×3 IMPLANT
SUT VICRYL 7 0 TG140 8 (SUTURE) ×3 IMPLANT
SYR 50ML LL SCALE MARK (SYRINGE) ×3 IMPLANT
SYRINGE 10CC LL (SYRINGE) ×3 IMPLANT

## 2016-01-02 NOTE — Anesthesia Postprocedure Evaluation (Signed)
Anesthesia Post Note  Patient: Troy Baldwin  Procedure(s) Performed: Procedure(s) (LRB): PARS PLANA VITRECTOMY WITH 25 GAUGE, 14 % C3F8 gas exchange (Right) MEMBRANE PEEL (Right) REPAIR OF COMPLEX TRACTION RETINAL DETACHMENT (Right)  Patient location during evaluation: Short Stay Anesthesia Type: MAC Level of consciousness: awake and alert and oriented Pain management: pain level controlled Vital Signs Assessment: post-procedure vital signs reviewed and stable Respiratory status: spontaneous breathing Cardiovascular status: stable Anesthetic complications: no    Last Vitals:  Filed Vitals:   01/02/16 0908 01/02/16 1156  BP: 140/68 118/71  Pulse: 66 57  Temp: 36.7 C 36.6 C  Resp: 16 16    Last Pain:  Filed Vitals:   01/02/16 1156  PainSc: 0-No pain                 Lanora Manis

## 2016-01-02 NOTE — Op Note (Signed)
.  PREOPERATIVE DIAGNOSIS: Epiretinal membrane, retinal detachment, right eye.          POST OPERATIVE DIAGNOSIS: Epiretinal membrane, retinal detachment, right eye.             OPERATION PERFORMED: 25-gauge pars plana vitrectomy with ICG, ERM and ILM peeling, air-fluid exchange and 14%C3F8, right eye.                 ANESTHESIA: MAC with retrobulbar block.   COMPLICATIONS: None.     BLOOD LOSS: Minimal.   SPECIMENS: None.   DESCRIPTION OF PROCEDURE: The patient was evaluated in the clinic for an epiretinal membrane visually significant in the right eye. He had a hisotry of vitrectomy for RD repair, and had a previous retinal detachment that had been walled off with laser prior.  After the risks, benefits, alternatives, and complications were discussed, the patient elected to proceed with pars plana vitrectomy in the right eye with membrane peeling.     On the day of surgery, the patient was greeted in the preoperative holding area.  The right eye was marked.  Any questions were answered.  The patient was then brought into the operating room in the supine position.  Next, 5 ml of a retrobulbar block consisting of 4% xylocaine plain, 0.75% sensorcaine plain and hylenex 100u was injected. The right eye was then prepped and draped in the usual sterile fashion.  Three 25-gauge trocars were used in the usual position. The infusion cannula was checked to ensure it was in the vitreous cavity prior to starting the infusion. Since the patient had a prior vitrectomy there was not much vitreous left to remove. ICG was applied to the retinal surface and then aspirated.  An ILM forcep was then used to remove the very dense epiretinal membrane and subsequently the ILM between the arcades and over the foveal center.  Attention was then turned back to the periphery.  A 360 degree scleral depression was performed.  There were additional no retinal tears or breaks noted.  Cautery was used to make a drain retinotomty in the area  of previous RD. Soft tipped cannula was then used to remove the subretinal fluid. An air-fluid exchange was then performed, and the detachment drained flat. Laser was used to retinopexy the drain retinotomy, and light prp inferiorly.  Four time the vitreous volume of 14% C3F8 was then infused into the vitreous cavity.  The trocars were removed and sutured with vicryl 7.0. The intraocular pressure was felt to be acceptable by palpation.  Subconjunctival cefuroxime and dexamethasone were injected along with a depot triamcinolone injection.  The patient was patched and shielded with atropine and neo-poly-dex ointment and taken to the recovery area in the stable condition.

## 2016-01-02 NOTE — H&P (Signed)
.  Previous H&P scanned in reviewed, patient examined, and no interval changes.  Please see scanned record for complete information.   

## 2016-01-02 NOTE — Discharge Instructions (Signed)
FOLLOW DR. Bethany'S POSTOP INSTRUCTION SHEET AS REVIEWED  AMBULATORY SURGERY  DISCHARGE INSTRUCTIONS   1) The drugs that you were given will stay in your system until tomorrow so for the next 24 hours you should not:  A) Drive an automobile B) Make any legal decisions C) Drink any alcoholic beverage   2) You may resume regular meals tomorrow.  Today it is better to start with liquids and gradually work up to solid foods.  You may eat anything you prefer, but it is better to start with liquids, then soup and crackers, and gradually work up to solid foods.   3) Please notify your doctor immediately if you have any unusual bleeding, trouble breathing, redness and pain at the surgery site, drainage, fever, or pain not relieved by medication.    4) Additional Instructions:        Please contact your physician with any problems or Same Day Surgery at [phone], Monday through Friday 6 am to 4 pm, or [place] at [facility] number at [phone].

## 2016-01-02 NOTE — Transfer of Care (Signed)
Immediate Anesthesia Transfer of Care Note  Patient: Troy Baldwin  Procedure(s) Performed: Procedure(s) with comments: PARS PLANA VITRECTOMY WITH 25 GAUGE, 14 % C3F8 gas exchange (Right) - fluid pack lot # [id number]   exp 08/17/2017 MEMBRANE PEEL (Right) REPAIR OF COMPLEX TRACTION RETINAL DETACHMENT (Right)  Patient Location: PACU and Short Stay  Anesthesia Type:MAC  Level of Consciousness: awake, alert  and oriented  Airway & Oxygen Therapy: Patient Spontanous Breathing  Post-op Assessment: Report given to RN and Post -op Vital signs reviewed and stable  Post vital signs: Reviewed and stable  Last Vitals:  Filed Vitals:   01/02/16 0908 01/02/16 1156  BP: 140/68 118/71  Pulse: 66 57  Temp: 36.7 C 36.6 C  Resp: 16 16    Last Pain:  Filed Vitals:   01/02/16 1156  PainSc: 0-No pain         Complications: No apparent anesthesia complications

## 2016-01-02 NOTE — Anesthesia Preprocedure Evaluation (Signed)
Anesthesia Evaluation  Patient identified by MRN, date of birth, ID band Patient awake    Reviewed: Allergy & Precautions, H&P , NPO status , Patient's Chart, lab work & pertinent test results, reviewed documented beta blocker date and time   History of Anesthesia Complications Negative for: history of anesthetic complications  Airway Mallampati: III  TM Distance: >3 FB Neck ROM: full    Dental no notable dental hx. (+) Missing, Poor Dentition   Pulmonary neg pulmonary ROS, Current Smoker,    Pulmonary exam normal breath sounds clear to auscultation       Cardiovascular Exercise Tolerance: Good hypertension, On Medications and On Home Beta Blockers (-) angina(-) CAD, (-) Past MI, (-) Cardiac Stents and (-) CABG Normal cardiovascular exam(-) dysrhythmias (-) Valvular Problems/Murmurs Rhythm:regular Rate:Normal     Neuro/Psych negative neurological ROS  negative psych ROS   GI/Hepatic Neg liver ROS, GERD  ,  Endo/Other  negative endocrine ROS  Renal/GU negative Renal ROS  negative genitourinary   Musculoskeletal   Abdominal   Peds  Hematology negative hematology ROS (+)   Anesthesia Other Findings Past Medical History:   Hypertension                                                 GERD (gastroesophageal reflux disease)                       Seasonal allergies                                           Complication of anesthesia                                     Comment:AM DIZZINESS AFTER BOTH EYE SURGERIES   Reproductive/Obstetrics negative OB ROS                             Anesthesia Physical Anesthesia Plan  ASA: II  Anesthesia Plan: MAC   Post-op Pain Management:    Induction:   Airway Management Planned:   Additional Equipment:   Intra-op Plan:   Post-operative Plan:   Informed Consent: I have reviewed the patients History and Physical, chart, labs and discussed the  procedure including the risks, benefits and alternatives for the proposed anesthesia with the patient or authorized representative who has indicated his/her understanding and acceptance.   Dental Advisory Given  Plan Discussed with: Anesthesiologist, CRNA and Surgeon  Anesthesia Plan Comments:         Anesthesia Quick Evaluation

## 2016-03-20 DIAGNOSIS — D485 Neoplasm of uncertain behavior of skin: Secondary | ICD-10-CM | POA: Diagnosis not present

## 2016-03-20 DIAGNOSIS — I10 Essential (primary) hypertension: Secondary | ICD-10-CM | POA: Diagnosis not present

## 2016-03-20 DIAGNOSIS — F17210 Nicotine dependence, cigarettes, uncomplicated: Secondary | ICD-10-CM | POA: Diagnosis not present

## 2016-03-20 DIAGNOSIS — E782 Mixed hyperlipidemia: Secondary | ICD-10-CM | POA: Diagnosis not present

## 2016-03-20 DIAGNOSIS — H33021 Retinal detachment with multiple breaks, right eye: Secondary | ICD-10-CM | POA: Diagnosis not present

## 2016-03-24 DIAGNOSIS — F17210 Nicotine dependence, cigarettes, uncomplicated: Secondary | ICD-10-CM | POA: Diagnosis not present

## 2016-03-24 DIAGNOSIS — I10 Essential (primary) hypertension: Secondary | ICD-10-CM | POA: Diagnosis not present

## 2016-03-24 DIAGNOSIS — K219 Gastro-esophageal reflux disease without esophagitis: Secondary | ICD-10-CM | POA: Diagnosis not present

## 2016-03-24 DIAGNOSIS — H33001 Unspecified retinal detachment with retinal break, right eye: Secondary | ICD-10-CM | POA: Diagnosis not present

## 2016-03-24 DIAGNOSIS — H33002 Unspecified retinal detachment with retinal break, left eye: Secondary | ICD-10-CM | POA: Diagnosis not present

## 2016-03-24 DIAGNOSIS — H33021 Retinal detachment with multiple breaks, right eye: Secondary | ICD-10-CM | POA: Diagnosis not present

## 2016-03-24 DIAGNOSIS — Z961 Presence of intraocular lens: Secondary | ICD-10-CM | POA: Diagnosis not present

## 2016-05-07 DIAGNOSIS — Z0001 Encounter for general adult medical examination with abnormal findings: Secondary | ICD-10-CM | POA: Diagnosis not present

## 2016-05-07 DIAGNOSIS — E782 Mixed hyperlipidemia: Secondary | ICD-10-CM | POA: Diagnosis not present

## 2016-05-07 DIAGNOSIS — R972 Elevated prostate specific antigen [PSA]: Secondary | ICD-10-CM | POA: Diagnosis not present

## 2016-05-07 DIAGNOSIS — Z122 Encounter for screening for malignant neoplasm of respiratory organs: Secondary | ICD-10-CM | POA: Diagnosis not present

## 2016-05-07 DIAGNOSIS — Z112 Encounter for screening for other bacterial diseases: Secondary | ICD-10-CM | POA: Diagnosis not present

## 2016-05-07 DIAGNOSIS — Z1159 Encounter for screening for other viral diseases: Secondary | ICD-10-CM | POA: Diagnosis not present

## 2016-05-07 DIAGNOSIS — Z125 Encounter for screening for malignant neoplasm of prostate: Secondary | ICD-10-CM | POA: Diagnosis not present

## 2016-05-07 DIAGNOSIS — I10 Essential (primary) hypertension: Secondary | ICD-10-CM | POA: Diagnosis not present

## 2016-07-04 DIAGNOSIS — H33011 Retinal detachment with single break, right eye: Secondary | ICD-10-CM | POA: Diagnosis not present

## 2016-09-05 DIAGNOSIS — H33011 Retinal detachment with single break, right eye: Secondary | ICD-10-CM | POA: Diagnosis not present

## 2016-09-16 DIAGNOSIS — Z0001 Encounter for general adult medical examination with abnormal findings: Secondary | ICD-10-CM | POA: Diagnosis not present

## 2016-09-16 DIAGNOSIS — R7301 Impaired fasting glucose: Secondary | ICD-10-CM | POA: Diagnosis not present

## 2016-09-16 DIAGNOSIS — I10 Essential (primary) hypertension: Secondary | ICD-10-CM | POA: Diagnosis not present

## 2016-09-16 DIAGNOSIS — E782 Mixed hyperlipidemia: Secondary | ICD-10-CM | POA: Diagnosis not present

## 2016-09-16 DIAGNOSIS — K219 Gastro-esophageal reflux disease without esophagitis: Secondary | ICD-10-CM | POA: Diagnosis not present

## 2016-09-16 DIAGNOSIS — F17210 Nicotine dependence, cigarettes, uncomplicated: Secondary | ICD-10-CM | POA: Diagnosis not present

## 2017-03-13 DIAGNOSIS — H33011 Retinal detachment with single break, right eye: Secondary | ICD-10-CM | POA: Diagnosis not present

## 2017-03-16 ENCOUNTER — Other Ambulatory Visit: Payer: Self-pay

## 2017-03-17 DIAGNOSIS — K219 Gastro-esophageal reflux disease without esophagitis: Secondary | ICD-10-CM | POA: Diagnosis not present

## 2017-03-17 DIAGNOSIS — E782 Mixed hyperlipidemia: Secondary | ICD-10-CM | POA: Diagnosis not present

## 2017-03-17 DIAGNOSIS — I10 Essential (primary) hypertension: Secondary | ICD-10-CM | POA: Diagnosis not present

## 2017-03-17 DIAGNOSIS — F17210 Nicotine dependence, cigarettes, uncomplicated: Secondary | ICD-10-CM | POA: Diagnosis not present

## 2017-03-19 DIAGNOSIS — H6063 Unspecified chronic otitis externa, bilateral: Secondary | ICD-10-CM | POA: Diagnosis not present

## 2017-03-19 DIAGNOSIS — H6123 Impacted cerumen, bilateral: Secondary | ICD-10-CM | POA: Diagnosis not present

## 2017-03-19 DIAGNOSIS — D485 Neoplasm of uncertain behavior of skin: Secondary | ICD-10-CM | POA: Diagnosis not present

## 2017-05-21 DIAGNOSIS — D2262 Melanocytic nevi of left upper limb, including shoulder: Secondary | ICD-10-CM | POA: Diagnosis not present

## 2017-05-21 DIAGNOSIS — L723 Sebaceous cyst: Secondary | ICD-10-CM | POA: Diagnosis not present

## 2017-05-21 DIAGNOSIS — D225 Melanocytic nevi of trunk: Secondary | ICD-10-CM | POA: Diagnosis not present

## 2017-05-21 DIAGNOSIS — C44311 Basal cell carcinoma of skin of nose: Secondary | ICD-10-CM | POA: Diagnosis not present

## 2017-05-21 DIAGNOSIS — C44319 Basal cell carcinoma of skin of other parts of face: Secondary | ICD-10-CM | POA: Diagnosis not present

## 2017-05-21 DIAGNOSIS — D485 Neoplasm of uncertain behavior of skin: Secondary | ICD-10-CM | POA: Diagnosis not present

## 2017-05-21 DIAGNOSIS — D2261 Melanocytic nevi of right upper limb, including shoulder: Secondary | ICD-10-CM | POA: Diagnosis not present

## 2017-05-21 DIAGNOSIS — C44212 Basal cell carcinoma of skin of right ear and external auricular canal: Secondary | ICD-10-CM | POA: Diagnosis not present

## 2017-06-23 DIAGNOSIS — E782 Mixed hyperlipidemia: Secondary | ICD-10-CM | POA: Diagnosis not present

## 2017-06-23 DIAGNOSIS — Z125 Encounter for screening for malignant neoplasm of prostate: Secondary | ICD-10-CM | POA: Diagnosis not present

## 2017-06-23 DIAGNOSIS — E538 Deficiency of other specified B group vitamins: Secondary | ICD-10-CM | POA: Diagnosis not present

## 2017-06-23 DIAGNOSIS — I10 Essential (primary) hypertension: Secondary | ICD-10-CM | POA: Diagnosis not present

## 2017-06-23 DIAGNOSIS — Z0001 Encounter for general adult medical examination with abnormal findings: Secondary | ICD-10-CM | POA: Diagnosis not present

## 2017-07-07 DIAGNOSIS — C44319 Basal cell carcinoma of skin of other parts of face: Secondary | ICD-10-CM | POA: Diagnosis not present

## 2017-07-07 DIAGNOSIS — L905 Scar conditions and fibrosis of skin: Secondary | ICD-10-CM | POA: Diagnosis not present

## 2017-07-15 DIAGNOSIS — C44319 Basal cell carcinoma of skin of other parts of face: Secondary | ICD-10-CM | POA: Diagnosis not present

## 2017-07-16 DIAGNOSIS — C44319 Basal cell carcinoma of skin of other parts of face: Secondary | ICD-10-CM | POA: Diagnosis not present

## 2017-07-16 DIAGNOSIS — L905 Scar conditions and fibrosis of skin: Secondary | ICD-10-CM | POA: Diagnosis not present

## 2017-09-08 DIAGNOSIS — L814 Other melanin hyperpigmentation: Secondary | ICD-10-CM | POA: Diagnosis not present

## 2017-09-08 DIAGNOSIS — L988 Other specified disorders of the skin and subcutaneous tissue: Secondary | ICD-10-CM | POA: Diagnosis not present

## 2017-09-08 DIAGNOSIS — C44311 Basal cell carcinoma of skin of nose: Secondary | ICD-10-CM | POA: Diagnosis not present

## 2017-09-08 DIAGNOSIS — L578 Other skin changes due to chronic exposure to nonionizing radiation: Secondary | ICD-10-CM | POA: Diagnosis not present

## 2017-09-15 DIAGNOSIS — H43812 Vitreous degeneration, left eye: Secondary | ICD-10-CM | POA: Diagnosis not present

## 2017-09-15 DIAGNOSIS — H338 Other retinal detachments: Secondary | ICD-10-CM | POA: Diagnosis not present

## 2017-10-14 DIAGNOSIS — H3321 Serous retinal detachment, right eye: Secondary | ICD-10-CM | POA: Diagnosis not present

## 2017-10-14 DIAGNOSIS — H4312 Vitreous hemorrhage, left eye: Secondary | ICD-10-CM | POA: Diagnosis not present

## 2017-10-20 DIAGNOSIS — Z85828 Personal history of other malignant neoplasm of skin: Secondary | ICD-10-CM | POA: Diagnosis not present

## 2017-10-20 DIAGNOSIS — Z08 Encounter for follow-up examination after completed treatment for malignant neoplasm: Secondary | ICD-10-CM | POA: Diagnosis not present

## 2017-10-20 DIAGNOSIS — D0359 Melanoma in situ of other part of trunk: Secondary | ICD-10-CM | POA: Diagnosis not present

## 2017-10-20 DIAGNOSIS — L821 Other seborrheic keratosis: Secondary | ICD-10-CM | POA: Diagnosis not present

## 2017-10-20 DIAGNOSIS — L570 Actinic keratosis: Secondary | ICD-10-CM | POA: Diagnosis not present

## 2017-10-20 DIAGNOSIS — D485 Neoplasm of uncertain behavior of skin: Secondary | ICD-10-CM | POA: Diagnosis not present

## 2017-10-20 DIAGNOSIS — X32XXXA Exposure to sunlight, initial encounter: Secondary | ICD-10-CM | POA: Diagnosis not present

## 2017-10-20 DIAGNOSIS — C44319 Basal cell carcinoma of skin of other parts of face: Secondary | ICD-10-CM | POA: Diagnosis not present

## 2017-10-30 ENCOUNTER — Other Ambulatory Visit: Payer: Self-pay

## 2017-10-30 MED ORDER — AMLODIPINE BESY-BENAZEPRIL HCL 5-20 MG PO CAPS: 1.0000 | ORAL_CAPSULE | Freq: Two times a day (BID) | ORAL | 3 refills | Status: DC

## 2017-10-30 MED ORDER — BENAZEPRIL HCL 20 MG PO TABS: 20.0000 mg | ORAL_TABLET | Freq: Every day | ORAL | 3 refills | Status: DC

## 2017-11-09 DIAGNOSIS — D0359 Melanoma in situ of other part of trunk: Secondary | ICD-10-CM | POA: Diagnosis not present

## 2017-11-10 DIAGNOSIS — L905 Scar conditions and fibrosis of skin: Secondary | ICD-10-CM | POA: Diagnosis not present

## 2017-11-16 DIAGNOSIS — T8140XA Infection following a procedure, unspecified, initial encounter: Secondary | ICD-10-CM | POA: Diagnosis not present

## 2017-12-17 DIAGNOSIS — H6123 Impacted cerumen, bilateral: Secondary | ICD-10-CM | POA: Diagnosis not present

## 2017-12-17 DIAGNOSIS — K219 Gastro-esophageal reflux disease without esophagitis: Secondary | ICD-10-CM | POA: Diagnosis not present

## 2017-12-31 DIAGNOSIS — L905 Scar conditions and fibrosis of skin: Secondary | ICD-10-CM | POA: Diagnosis not present

## 2017-12-31 DIAGNOSIS — C44319 Basal cell carcinoma of skin of other parts of face: Secondary | ICD-10-CM | POA: Diagnosis not present

## 2018-01-12 DIAGNOSIS — H4312 Vitreous hemorrhage, left eye: Secondary | ICD-10-CM | POA: Diagnosis not present

## 2018-01-12 DIAGNOSIS — H3321 Serous retinal detachment, right eye: Secondary | ICD-10-CM | POA: Diagnosis not present

## 2018-02-02 ENCOUNTER — Other Ambulatory Visit: Payer: Self-pay

## 2018-02-02 MED ORDER — AMLODIPINE BESY-BENAZEPRIL HCL 5-20 MG PO CAPS: 1.0000 | ORAL_CAPSULE | Freq: Two times a day (BID) | ORAL | 3 refills | Status: DC

## 2018-04-05 ENCOUNTER — Encounter: Payer: Self-pay | Admitting: Nurse Practitioner

## 2018-04-05 ENCOUNTER — Ambulatory Visit (INDEPENDENT_AMBULATORY_CARE_PROVIDER_SITE_OTHER): Payer: Medicare Other | Admitting: Nurse Practitioner

## 2018-04-05 VITALS — BP 140/67 | HR 70 | Resp 16 | Ht 72.0 in | Wt 202.8 lb

## 2018-04-05 DIAGNOSIS — E785 Hyperlipidemia, unspecified: Secondary | ICD-10-CM | POA: Insufficient documentation

## 2018-04-05 DIAGNOSIS — E782 Mixed hyperlipidemia: Secondary | ICD-10-CM | POA: Diagnosis not present

## 2018-04-05 DIAGNOSIS — K219 Gastro-esophageal reflux disease without esophagitis: Secondary | ICD-10-CM

## 2018-04-05 DIAGNOSIS — I10 Essential (primary) hypertension: Secondary | ICD-10-CM | POA: Diagnosis not present

## 2018-04-05 DIAGNOSIS — Z0001 Encounter for general adult medical examination with abnormal findings: Secondary | ICD-10-CM | POA: Diagnosis not present

## 2018-04-05 DIAGNOSIS — E291 Testicular hypofunction: Secondary | ICD-10-CM

## 2018-04-05 MED ORDER — LOVASTATIN 40 MG PO TABS: 40.0000 mg | ORAL_TABLET | Freq: Every day | ORAL | 4 refills | Status: DC

## 2018-04-05 MED ORDER — BISOPROLOL-HYDROCHLOROTHIAZIDE 2.5-6.25 MG PO TABS: 1.0000 | ORAL_TABLET | Freq: Every day | ORAL | 4 refills | Status: DC

## 2018-04-05 MED ORDER — AMLODIPINE BESYLATE 5 MG PO TABS: 5.0000 mg | ORAL_TABLET | Freq: Every day | ORAL | 4 refills | Status: DC

## 2018-04-05 MED ORDER — BENAZEPRIL HCL 20 MG PO TABS
20.0000 mg | ORAL_TABLET | Freq: Every day | ORAL | 4 refills | Status: DC
Start: 2018-04-05 — End: 2018-04-13

## 2018-04-05 NOTE — Progress Notes (Signed)
[facility] [place], [place] [address]  Internal MEDICINE  Office Visit Note  Patient Name: Troy Baldwin  [id number]  [id number]  Date of Service: 04/18/2018   Pt is here for routine health maintenance examination   Chief Complaint  Patient presents with  . Annual Exam  . Hypertension     Hypertension  This is a chronic problem. The current episode started more than 1 year ago. The problem is unchanged. The problem is resistant. Pertinent negatives include no chest pain, headaches, neck pain, palpitations or shortness of breath. There are no associated agents to hypertension. Risk factors for coronary artery disease include dyslipidemia, male gender and smoking/tobacco exposure. Past treatments include ACE inhibitors and calcium channel blockers. The current treatment provides moderate improvement.    Current Medication: Outpatient Encounter Medications as of 04/05/2018  Medication Sig  . Ascorbic Acid (VITAMIN C) 1000 MG tablet Take 1,000 mg by mouth daily.  . bisoprolol-hydrochlorothiazide (ZIAC) 2.5-6.25 MG tablet Take 1 tablet by mouth daily.  . Calcium Carb-Cholecalciferol (CALTRATE 600+D3 SOFT PO) Take by mouth.  . Garlic 0263 MG CAPS Take by mouth.  . lovastatin (MEVACOR) 40 MG tablet Take 1 tablet (40 mg total) by mouth at bedtime.  . Omega-3 Fatty Acids (FISH OIL) 1000 MG CAPS Take by mouth.  Marland Kitchen omeprazole (PRILOSEC) 40 MG capsule Take 40 mg by mouth every morning.  . [DISCONTINUED] amLODipine-benazepril (LOTREL) 5-20 MG capsule Take 1 capsule by mouth 2 (two) times daily.  . [DISCONTINUED] benazepril (LOTENSIN) 20 MG tablet Take 1 tablet (20 mg total) by mouth daily.  . [DISCONTINUED] benazepril (LOTENSIN) 20 MG tablet Take 1 tablet (20 mg total) by mouth daily.  . [DISCONTINUED] bisoprolol-hydrochlorothiazide (ZIAC) 2.5-6.25 MG tablet Take 1 tablet by mouth PC lunch.  . [DISCONTINUED] lovastatin (MEVACOR) 40 MG tablet Take 40 mg by mouth at  bedtime.  . [DISCONTINUED] amLODipine (NORVASC) 5 MG tablet Take 1 tablet (5 mg total) by mouth daily.   No facility-administered encounter medications on file as of 04/05/2018.     Surgical History: Past Surgical History:  Procedure Laterality Date  . APPENDECTOMY    . CHOLECYSTECTOMY    . CYST EXCISION     from chest  . EYE SURGERY     cataract  . MEMBRANE PEEL Right 01/02/2016   Procedure: MEMBRANE PEEL;  Surgeon: Milus Height, MD;  Location: [facility];  Service: Ophthalmology;  Laterality: Right;  . mole surgery     7 surgeries  . NOSE SURGERY    . PARS PLANA VITRECTOMY Right 08/01/2015   Procedure: PARS PLANA VITRECTOMY WITH 25 GAUGE,endolaser,gas exchange;  Surgeon: Milus Height, MD;  Location: [facility];  Service: Ophthalmology;  Laterality: Right;  casette lot #[id number]      28% SF6  . PARS PLANA VITRECTOMY Right 01/02/2016   Procedure: PARS PLANA VITRECTOMY WITH 25 GAUGE, 14 % C3F8 gas exchange;  Surgeon: Milus Height, MD;  Location: [facility];  Service: Ophthalmology;  Laterality: Right;  fluid pack lot # [id number]   exp 08/17/2017  . PTERYGIA     X 2  . 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 Cataract   . Complication of anesthesia    AM DIZZINESS AFTER BOTH EYE SURGERIES  . GERD (gastroesophageal reflux  disease)   . Hay fever   . Hypertension   . Seasonal allergies     Family History: Family History  Problem Relation Age of Onset  . Osteoarthritis Mother       Review of Systems  Constitutional: Negative for activity change, appetite change, chills, fatigue and unexpected weight change.  HENT: Negative for congestion, postnasal drip, rhinorrhea, sneezing and sore throat.   Eyes: Negative for redness.       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 He sees eye doctor regularly.   Respiratory: Negative for cough, chest tightness, shortness of breath and wheezing.   Cardiovascular: Negative for chest pain and palpitations.  Gastrointestinal: Negative for abdominal pain, constipation, diarrhea, nausea and vomiting.  Endocrine: Negative for cold intolerance, heat intolerance, polydipsia, polyphagia and polyuria.  Genitourinary: Negative.  Negative for dysuria and frequency.  Musculoskeletal: Negative for arthralgias, back pain, joint swelling and neck pain.  Skin: Negative for rash.  Allergic/Immunologic: Negative for environmental allergies.  Neurological: Negative for dizziness, tremors, numbness and headaches.  Hematological: Negative for adenopathy. Does not bruise/bleed easily.  Psychiatric/Behavioral: Negative for behavioral problems (Depression), sleep disturbance and suicidal ideas. The patient is not nervous/anxious.      Today's Vitals   04/05/18 1502  BP: 140/67  Pulse: 70  Resp: 16  SpO2: 97%  Weight: 202 lb 12.8 oz (92 kg)  Height: 6' (1.829 m)    Physical Exam  Constitutional: He is oriented to person, place, and time. He appears well-developed and well-nourished. No distress.  HENT:  Head: Normocephalic and atraumatic.  Nose: Nose normal.  Mouth/Throat: Oropharynx is clear and moist. No oropharyngeal exudate.  Eyes: Pupils are equal, round, and reactive to light. Conjunctivae and EOM are normal.  Neck: Normal range of motion. Neck supple. No JVD present. Carotid bruit is not present. No tracheal deviation present. No thyromegaly present.  Cardiovascular: Normal rate, regular rhythm, normal heart sounds and intact distal pulses. Exam reveals no gallop and no friction rub.  No murmur heard. Pulmonary/Chest: Effort normal and breath sounds normal. No respiratory distress. He has no wheezes. He has no rales. He exhibits no tenderness.  Abdominal: Soft. Bowel sounds are  normal. There is no tenderness.  Musculoskeletal: Normal range of motion.  Lymphadenopathy:    He has no cervical adenopathy.  Neurological: He is alert and oriented to person, place, and time. No cranial nerve deficit.  Skin: Skin is warm and dry. Capillary refill takes less than 2 seconds. He is not diaphoretic.  Psychiatric: He has a normal mood and affect. His behavior is normal. Judgment and thought content normal.  Nursing note and vitals reviewed.  Assessment/Plan: 1. Encounter for general adult medical examination with abnormal findings Annual health maintenance exam today  2. Hypertension, unspecified type Stable. Continue all blood pressure medications as prescribed. Refills provided today. - bisoprolol-hydrochlorothiazide (ZIAC) 2.5-6.25 MG tablet; Take 1 tablet by mouth daily.  Dispense: 90 tablet; Refill: 4  3. Mixed hyperlipidemia Will check fasting lipid panel and adjust lovastatin as indicated.  - lovastatin (MEVACOR) 40 MG tablet; Take 1 tablet (40 mg total) by mouth at bedtime.  Dispense: 90 tablet; Refill: 4  4. Gastroesophageal reflux disease without esophagitis Recommend otc zantac 150mg  twice daily as needed. Advised he avoid known triggers.    5. Testicular hypofunction Testosterone levels to be checked and treated as appropriate.   General Counseling: romney compean understanding of the findings of todays visit and agrees with plan of  treatment. I have discussed any further diagnostic evaluation that may be needed or ordered today. We also reviewed his medications today. he has been encouraged to call the office with any questions or concerns that should arise related to todays visit.    Counseling:  Hypertension Counseling:   The following hypertensive lifestyle modification were recommended and discussed:  1. Limiting alcohol intake to less than 1 oz/day of ethanol:(24 oz of beer or 8 oz of wine or 2 oz of 100-proof whiskey). 2. Take baby ASA 81 mg  daily. 3. Importance of regular aerobic exercise and losing weight. 4. Reduce dietary saturated fat and cholesterol intake for overall cardiovascular health. 5. Maintaining adequate dietary potassium, calcium, and magnesium intake. 6. Regular monitoring of the blood pressure. 7. Reduce sodium intake to less than 100 mmol/day (less than 2.3 gm of sodium or less than 6 gm of sodium choride)   This patient was seen by [facility] with Dr Lavera Guise as a part of collaborative care agreement    Meds ordered this encounter  Medications  . DISCONTD: benazepril (LOTENSIN) 20 MG tablet    Sig: Take 1 tablet (20 mg total) by mouth daily.    Dispense:  90 tablet    Refill:  4    Order Specific Question:   Supervising Provider    Answer:   Lavera Guise [[id number]]  . bisoprolol-hydrochlorothiazide (ZIAC) 2.5-6.25 MG tablet    Sig: Take 1 tablet by mouth daily.    Dispense:  90 tablet    Refill:  4    Order Specific Question:   Supervising Provider    Answer:   Lavera Guise [[id number]]  . lovastatin (MEVACOR) 40 MG tablet    Sig: Take 1 tablet (40 mg total) by mouth at bedtime.    Dispense:  90 tablet    Refill:  4    Order Specific Question:   Supervising Provider    Answer:   Lavera Guise [[id number]]  . DISCONTD: amLODipine (NORVASC) 5 MG tablet    Sig: Take 1 tablet (5 mg total) by mouth daily.    Dispense:  180 tablet    Refill:  4    Order Specific Question:   Supervising Provider    Answer:   Lavera Guise [[id number]]    Time spent: [place], MD  Internal Medicine

## 2018-04-13 ENCOUNTER — Other Ambulatory Visit: Payer: Self-pay | Admitting: Nurse Practitioner

## 2018-04-13 DIAGNOSIS — I10 Essential (primary) hypertension: Secondary | ICD-10-CM

## 2018-04-13 MED ORDER — AMLODIPINE BESY-BENAZEPRIL HCL 5-20 MG PO CAPS: 1.0000 | ORAL_CAPSULE | Freq: Two times a day (BID) | ORAL | 5 refills | Status: DC

## 2018-04-18 DIAGNOSIS — Z0001 Encounter for general adult medical examination with abnormal findings: Secondary | ICD-10-CM | POA: Insufficient documentation

## 2018-06-10 DIAGNOSIS — Z85820 Personal history of malignant melanoma of skin: Secondary | ICD-10-CM | POA: Diagnosis not present

## 2018-06-10 DIAGNOSIS — Z08 Encounter for follow-up examination after completed treatment for malignant neoplasm: Secondary | ICD-10-CM | POA: Diagnosis not present

## 2018-06-10 DIAGNOSIS — Z85828 Personal history of other malignant neoplasm of skin: Secondary | ICD-10-CM | POA: Diagnosis not present

## 2018-06-10 DIAGNOSIS — D485 Neoplasm of uncertain behavior of skin: Secondary | ICD-10-CM | POA: Diagnosis not present

## 2018-06-10 DIAGNOSIS — L570 Actinic keratosis: Secondary | ICD-10-CM | POA: Diagnosis not present

## 2018-06-10 DIAGNOSIS — C44311 Basal cell carcinoma of skin of nose: Secondary | ICD-10-CM | POA: Diagnosis not present

## 2018-06-10 DIAGNOSIS — X32XXXA Exposure to sunlight, initial encounter: Secondary | ICD-10-CM | POA: Diagnosis not present

## 2018-07-14 DIAGNOSIS — D485 Neoplasm of uncertain behavior of skin: Secondary | ICD-10-CM | POA: Diagnosis not present

## 2018-07-20 DIAGNOSIS — K219 Gastro-esophageal reflux disease without esophagitis: Secondary | ICD-10-CM | POA: Diagnosis not present

## 2018-07-20 DIAGNOSIS — H6123 Impacted cerumen, bilateral: Secondary | ICD-10-CM | POA: Diagnosis not present

## 2018-07-20 DIAGNOSIS — H6062 Unspecified chronic otitis externa, left ear: Secondary | ICD-10-CM | POA: Diagnosis not present

## 2018-07-29 ENCOUNTER — Other Ambulatory Visit: Payer: Self-pay | Admitting: Nurse Practitioner

## 2018-07-29 DIAGNOSIS — E782 Mixed hyperlipidemia: Secondary | ICD-10-CM | POA: Diagnosis not present

## 2018-07-29 DIAGNOSIS — I10 Essential (primary) hypertension: Secondary | ICD-10-CM | POA: Diagnosis not present

## 2018-07-29 DIAGNOSIS — Z125 Encounter for screening for malignant neoplasm of prostate: Secondary | ICD-10-CM | POA: Diagnosis not present

## 2018-07-29 DIAGNOSIS — Z0001 Encounter for general adult medical examination with abnormal findings: Secondary | ICD-10-CM | POA: Diagnosis not present

## 2018-07-29 DIAGNOSIS — E291 Testicular hypofunction: Secondary | ICD-10-CM | POA: Diagnosis not present

## 2018-07-30 LAB — LIPID PANEL W/O CHOL/HDL RATIO
Cholesterol, Total: 169 mg/dL (ref 100–199)
HDL: 32 mg/dL — AB (ref >39)
LDL Calculated: 96 mg/dL (ref 0–99)
TRIGLYCERIDES: 204 mg/dL — AB (ref 0–149)
VLDL CHOLESTEROL CAL: 41 mg/dL — AB (ref 5–40)

## 2018-07-30 LAB — COMPREHENSIVE METABOLIC PANEL
ALBUMIN: 4.5 g/dL (ref 3.5–4.8)
ALK PHOS: 78 IU/L (ref 39–117)
ALT: 23 IU/L (ref 0–44)
AST: 17 IU/L (ref 0–40)
Albumin/Globulin Ratio: 1.7 (ref 1.2–2.2)
BILIRUBIN TOTAL: 0.7 mg/dL (ref 0.0–1.2)
BUN / CREAT RATIO: 13 (ref 10–24)
BUN: 14 mg/dL (ref 8–27)
CHLORIDE: 102 mmol/L (ref 96–106)
CO2: 23 mmol/L (ref 20–29)
CREATININE: 1.07 mg/dL (ref 0.76–1.27)
Calcium: 9.9 mg/dL (ref 8.6–10.2)
GFR calc Af Amer: 79 mL/min/{1.73_m2} (ref >59)
GFR calc non Af Amer: 68 mL/min/{1.73_m2} (ref >59)
GLUCOSE: 130 mg/dL — AB (ref 65–99)
Globulin, Total: 2.7 g/dL (ref 1.5–4.5)
Potassium: 4.8 mmol/L (ref 3.5–5.2)
Sodium: 141 mmol/L (ref 134–144)
Total Protein: 7.2 g/dL (ref 6.0–8.5)

## 2018-07-30 LAB — CBC
HEMATOCRIT: 47.1 % (ref 37.5–51.0)
Hemoglobin: 16.2 g/dL (ref 13.0–17.7)
MCH: 31.3 pg (ref 26.6–33.0)
MCHC: 34.4 g/dL (ref 31.5–35.7)
MCV: 91 fL (ref 79–97)
Platelets: 272 10*3/uL (ref 150–450)
RBC: 5.17 x10E6/uL (ref 4.14–5.80)
RDW: 12.6 % (ref 12.3–15.4)
WBC: 6.7 10*3/uL (ref 3.4–10.8)

## 2018-07-30 LAB — T4, FREE: Free T4: 1.48 ng/dL (ref 0.82–1.77)

## 2018-07-30 LAB — PSA: PROSTATE SPECIFIC AG, SERUM: 3.8 ng/mL (ref 0.0–4.0)

## 2018-07-30 LAB — TSH: TSH: 1.95 u[IU]/mL (ref 0.450–4.500)

## 2018-07-30 LAB — TESTOSTERONE: Testosterone: 317 ng/dL (ref 264–916)

## 2018-09-09 DIAGNOSIS — C4401 Basal cell carcinoma of skin of lip: Secondary | ICD-10-CM | POA: Diagnosis not present

## 2018-10-07 ENCOUNTER — Ambulatory Visit: Payer: Self-pay | Admitting: Nurse Practitioner

## 2018-10-11 ENCOUNTER — Ambulatory Visit (INDEPENDENT_AMBULATORY_CARE_PROVIDER_SITE_OTHER): Payer: Medicare Other | Admitting: Nurse Practitioner

## 2018-10-11 ENCOUNTER — Encounter: Payer: Self-pay | Admitting: Nurse Practitioner

## 2018-10-11 VITALS — BP 153/81 | HR 77 | Resp 16 | Ht 72.0 in | Wt 212.4 lb

## 2018-10-11 DIAGNOSIS — K219 Gastro-esophageal reflux disease without esophagitis: Secondary | ICD-10-CM | POA: Diagnosis not present

## 2018-10-11 DIAGNOSIS — E782 Mixed hyperlipidemia: Secondary | ICD-10-CM

## 2018-10-11 DIAGNOSIS — I10 Essential (primary) hypertension: Secondary | ICD-10-CM

## 2018-10-11 DIAGNOSIS — F17210 Nicotine dependence, cigarettes, uncomplicated: Secondary | ICD-10-CM | POA: Diagnosis not present

## 2018-10-11 MED ORDER — AMLODIPINE BESY-BENAZEPRIL HCL 5-20 MG PO CAPS
1.0000 | ORAL_CAPSULE | Freq: Two times a day (BID) | ORAL | 3 refills | Status: DC
Start: 2018-10-11 — End: 2019-04-15

## 2018-10-11 NOTE — Progress Notes (Signed)
[facility] [place], [place] [address]  Internal MEDICINE  Office Visit Note  Patient Name: Troy Baldwin  [id number]  [id number]  Date of Service: 10/20/2018  Chief Complaint  Patient presents with  . Medical Management of Chronic Issues    6 month follow up    The patient recently had routine, fasting labs drawn. Triglyceride levels were mildly elevated. LDL and total cholesterol were normal. Has gained a little weight since his last visit. Contributing to elevated blood pressure and elevated cholesterol levels.   Hypertension  This is a chronic problem. The current episode started more than 1 year ago. The problem is unchanged. The problem is resistant. Pertinent negatives include no chest pain, headaches, neck pain, palpitations or shortness of breath. There are no associated agents to hypertension. Risk factors for coronary artery disease include dyslipidemia, male gender and smoking/tobacco exposure. Past treatments include ACE inhibitors and calcium channel blockers. The current treatment provides moderate improvement. Compliance problems include exercise.        Current Medication: Outpatient Encounter Medications as of 10/11/2018  Medication Sig  . amLODipine-benazepril (LOTREL) 5-20 MG capsule Take 1 capsule by mouth 2 (two) times daily.  . Ascorbic Acid (VITAMIN C) 1000 MG tablet Take 1,000 mg by mouth daily.  . bisoprolol-hydrochlorothiazide (ZIAC) 2.5-6.25 MG tablet Take 1 tablet by mouth daily.  . Calcium Carb-Cholecalciferol (CALTRATE 600+D3 SOFT PO) Take by mouth.  . Garlic 9381 MG CAPS Take by mouth.  . lovastatin (MEVACOR) 40 MG tablet Take 1 tablet (40 mg total) by mouth at bedtime.  . Omega-3 Fatty Acids (FISH OIL) 1000 MG CAPS Take by mouth.  Marland Kitchen omeprazole (PRILOSEC) 40 MG capsule Take 40 mg by mouth every morning.  . [DISCONTINUED] amLODipine-benazepril (LOTREL) 5-20 MG capsule Take 1 capsule by mouth 2 (two) times daily.   No  facility-administered encounter medications on file as of 10/11/2018.     Surgical History: Past Surgical History:  Procedure Laterality Date  . APPENDECTOMY    . CHOLECYSTECTOMY    . CYST EXCISION     from chest  . EYE SURGERY     cataract  . MEMBRANE PEEL Right 01/02/2016   Procedure: MEMBRANE PEEL;  Surgeon: Milus Height, MD;  Location: [facility];  Service: Ophthalmology;  Laterality: Right;  . mole surgery     7 surgeries  . NOSE SURGERY    . PARS PLANA VITRECTOMY Right 08/01/2015   Procedure: PARS PLANA VITRECTOMY WITH 25 GAUGE,endolaser,gas exchange;  Surgeon: Milus Height, MD;  Location: [facility];  Service: Ophthalmology;  Laterality: Right;  casette lot #[id number]      28% SF6  . PARS PLANA VITRECTOMY Right 01/02/2016   Procedure: PARS PLANA VITRECTOMY WITH 25 GAUGE, 14 % C3F8 gas exchange;  Surgeon: Milus Height, MD;  Location: [facility];  Service: Ophthalmology;  Laterality: Right;  fluid pack lot # [id number]   exp 08/17/2017  . PTERYGIA     X 2  . 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 Cataract   . Complication of anesthesia    AM DIZZINESS AFTER BOTH EYE SURGERIES  . GERD (gastroesophageal reflux disease)   . Hay fever   . Hypertension   . Seasonal allergies     Family History: Family History  Problem Relation Age of Onset  . Osteoarthritis Mother     Social History   Socioeconomic History  . Marital status: Married    Spouse name: Not on file  . Number of children: Not on file  . Years of education: Not on file  . Highest education level: Not on file  Occupational History  . Not on file  Social Needs  . Financial resource strain: Not on file  . Food insecurity:    Worry: Not on file    Inability: Not on  file  . Transportation needs:    Medical: Not on file    Non-medical: Not on file  Tobacco Use  . Smoking status: Current Some Day Smoker    Packs/day: 1.00    Types: Cigarettes  . Smokeless tobacco: Never Used  Substance and Sexual Activity  . Alcohol use: Yes    Alcohol/week: 5.0 standard drinks    Types: 5 Cans of beer per week    Comment: 5 beers a week/ occasionally  . Drug use: No  . Sexual activity: Not on file  Lifestyle  . Physical activity:    Days per week: Not on file    Minutes per session: Not on file  . Stress: Not on file  Relationships  . Social connections:    Talks on phone: Not on file    Gets together: Not on file    Attends religious service: Not on file    Active member of club or organization: Not on file    Attends meetings of clubs or organizations: Not on file    Relationship status: Not on file  . Intimate partner violence:    Fear of current or ex partner: Not on file    Emotionally abused: Not on file    Physically abused: Not on file    Forced sexual activity: Not on file  Other Topics Concern  . Not on file  Social History Narrative  . Not on file      Review of Systems  Constitutional: Negative for activity change, appetite change, chills, fatigue and unexpected weight change.  HENT: Negative for congestion, postnasal drip, rhinorrhea, sneezing and sore throat.   Eyes: Negative for redness.          Respiratory: Negative for cough, chest tightness, shortness of breath and wheezing.   Cardiovascular: Negative for chest pain and palpitations.       Elevated blood pressure today.  Gastrointestinal: Negative for abdominal pain, constipation, diarrhea, nausea and vomiting.  Endocrine: Negative for cold intolerance, heat intolerance, polydipsia and polyuria.  Musculoskeletal: Negative for arthralgias, back pain, joint swelling and neck pain.  Skin: Negative for rash.       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   Allergic/Immunologic: Negative for environmental allergies.  Neurological: Negative for dizziness, tremors, numbness and headaches.  Hematological: Negative for adenopathy. Does not bruise/bleed easily.  Psychiatric/Behavioral: Negative for behavioral problems (Depression), sleep disturbance and suicidal ideas. The patient is not nervous/anxious.     Today's Vitals   10/11/18 1453  BP: (!) 153/81  Pulse: 77  Resp: 16  SpO2: 96%  Weight: 212 lb 6.4 oz (96.3 kg)  Height: 6' (1.829 m)   Body mass index is 28.81 kg/m.  Physical Exam Vitals signs and nursing note reviewed.  Constitutional:      General: He is not in acute distress.    Appearance: Normal appearance. He is well-developed. He is not  diaphoretic.  HENT:     Head: Normocephalic and atraumatic.     Nose: Nose normal.     Mouth/Throat:     Pharynx: No oropharyngeal exudate.  Eyes:     Conjunctiva/sclera: Conjunctivae normal.     Pupils: Pupils are equal, round, and reactive to light.  Neck:     Musculoskeletal: Normal range of motion and neck supple.     Thyroid: No thyromegaly.     Vascular: No carotid bruit or JVD.     Trachea: No tracheal deviation.  Cardiovascular:     Rate and Rhythm: Normal rate and regular rhythm.     Heart sounds: Normal heart sounds. No murmur. No friction rub. No gallop.   Pulmonary:     Effort: Pulmonary effort is normal. No respiratory distress.     Breath sounds: Normal breath sounds. No wheezing or rales.  Chest:     Chest wall: No tenderness.  Abdominal:     General: Bowel sounds are normal.     Palpations: Abdomen is soft.     Tenderness: There is no abdominal tenderness.  Musculoskeletal: Normal range of motion.  Lymphadenopathy:     Cervical: No cervical adenopathy.  Skin:    General: Skin is warm and dry.     Capillary Refill: Capillary refill takes less than 2 seconds.  Neurological:     Mental Status: He is alert and oriented to person, place,  and time.     Cranial Nerves: No cranial nerve deficit.  Psychiatric:        Behavior: Behavior normal.        Thought Content: Thought content normal.        Judgment: Judgment normal.   Assessment/Plan: 1. Hypertension, unspecified type Stable. Continue bp medication as prescribed  - amLODipine-benazepril (LOTREL) 5-20 MG capsule; Take 1 capsule by mouth 2 (two) times daily.  Dispense: 180 capsule; Refill: 3  2. Mixed hyperlipidemia Reviewed lipid panel. Continue lovastatin as prescribed.   3. Gastroesophageal reflux disease without esophagitis Continue omeprazole as prescribed   4. Cigarette smoker Smoking cessation discussed with patient.   General Counseling: hadyn blanck understanding of the findings of todays visit and agrees with plan of treatment. I have discussed any further diagnostic evaluation that may be needed or ordered today. We also reviewed his medications today. he has been encouraged to call the office with any questions or concerns that should arise related to todays visit.  Hypertension Counseling:   The following hypertensive lifestyle modification were recommended and discussed:  1. Limiting alcohol intake to less than 1 oz/day of ethanol:(24 oz of beer or 8 oz of wine or 2 oz of 100-proof whiskey). 2. Take baby ASA 81 mg daily. 3. Importance of regular aerobic exercise and losing weight. 4. Reduce dietary saturated fat and cholesterol intake for overall cardiovascular health. 5. Maintaining adequate dietary potassium, calcium, and magnesium intake. 6. Regular monitoring of the blood pressure. 7. Reduce sodium intake to less than 100 mmol/day (less than 2.3 gm of sodium or less than 6 gm of sodium choride)   This patient was seen by [place] with Dr Lavera Guise as a part of collaborative care agreement  Meds ordered this encounter  Medications  . amLODipine-benazepril (LOTREL) 5-20 MG capsule    Sig: Take 1 capsule by mouth 2  (two) times daily.    Dispense:  180 capsule    Refill:  3    Please d/c indivicual orders for amlodipine and  benazapril.    Order Specific Question:   Supervising Provider    Answer:   Lavera Guise [[id number]]    Time spent: 2 Minutes      Dr Lavera Guise Internal medicine

## 2018-12-16 ENCOUNTER — Other Ambulatory Visit: Payer: Self-pay | Admitting: Internal Medicine

## 2018-12-16 DIAGNOSIS — E782 Mixed hyperlipidemia: Secondary | ICD-10-CM

## 2019-02-23 DIAGNOSIS — H6062 Unspecified chronic otitis externa, left ear: Secondary | ICD-10-CM | POA: Diagnosis not present

## 2019-02-23 DIAGNOSIS — H6123 Impacted cerumen, bilateral: Secondary | ICD-10-CM | POA: Diagnosis not present

## 2019-04-15 ENCOUNTER — Encounter: Payer: Self-pay | Admitting: Nurse Practitioner

## 2019-04-15 ENCOUNTER — Ambulatory Visit (INDEPENDENT_AMBULATORY_CARE_PROVIDER_SITE_OTHER): Payer: Medicare Other | Admitting: Nurse Practitioner

## 2019-04-15 VITALS — BP 142/78 | HR 60 | Resp 16 | Ht 72.0 in | Wt 211.0 lb

## 2019-04-15 DIAGNOSIS — E782 Mixed hyperlipidemia: Secondary | ICD-10-CM | POA: Diagnosis not present

## 2019-04-15 DIAGNOSIS — I10 Essential (primary) hypertension: Secondary | ICD-10-CM

## 2019-04-15 DIAGNOSIS — Z0001 Encounter for general adult medical examination with abnormal findings: Secondary | ICD-10-CM | POA: Diagnosis not present

## 2019-04-15 DIAGNOSIS — R300 Dysuria: Secondary | ICD-10-CM

## 2019-04-15 DIAGNOSIS — K219 Gastro-esophageal reflux disease without esophagitis: Secondary | ICD-10-CM

## 2019-04-15 MED ORDER — LOVASTATIN 40 MG PO TABS: 80.0000 mg | ORAL_TABLET | Freq: Every day | ORAL | 3 refills | Status: DC

## 2019-04-15 MED ORDER — BISOPROLOL-HYDROCHLOROTHIAZIDE 2.5-6.25 MG PO TABS: 1.0000 | ORAL_TABLET | Freq: Every day | ORAL | 3 refills | Status: DC

## 2019-04-15 MED ORDER — OMEPRAZOLE 40 MG PO CPDR: 40.0000 mg | DELAYED_RELEASE_CAPSULE | ORAL | 3 refills | Status: AC

## 2019-04-15 MED ORDER — AMLODIPINE BESY-BENAZEPRIL HCL 5-20 MG PO CAPS: 1.0000 | ORAL_CAPSULE | Freq: Two times a day (BID) | ORAL | 3 refills | Status: DC

## 2019-04-15 NOTE — Progress Notes (Addendum)
[facility] [place], [place] [address]  Internal MEDICINE  Office Visit Note  Patient Name: Troy Baldwin  [id number]  [id number]  Date of Service: 04/15/2019   Pt is here for routine health maintenance examination  Chief Complaint  Patient presents with  . Annual Exam  . Hypertension  . Gastroesophageal Reflux     The patient is here for health maintenance exam. Blood pressure is mildly elevated today. This is unusual. He saw his ear doctor recently and blood pressure was very good. He had lab work done. He does have some joint and muscle pain in his legs. This is worse with exertion. Has to rest after shorts bursts of activity. He states that this has been worse over the past few months, as he has not been as active as usual. He contributes this to cholesterol medication. He takes lovastatin 40mg  daily. He does take CoQ10 supplement every day. Generally, blood pressure is well controlled. He denies chest pain, chest pressure, and shortness of breath. Labs, done in December, were generally good.     Current Medication: Outpatient Encounter Medications as of 04/15/2019  Medication Sig  . amLODipine-benazepril (LOTREL) 5-20 MG capsule Take 1 capsule by mouth 2 (two) times daily.  . Ascorbic Acid (VITAMIN C) 1000 MG tablet Take 1,000 mg by mouth daily.  . bisoprolol-hydrochlorothiazide (ZIAC) 2.5-6.25 MG tablet Take 1 tablet by mouth daily.  . Calcium Carb-Cholecalciferol (CALTRATE 600+D3 SOFT PO) Take by mouth.  . Garlic 123XX123 MG CAPS Take by mouth.  . lovastatin (MEVACOR) 40 MG tablet Take 2 tablets (80 mg total) by mouth at bedtime.  . Omega-3 Fatty Acids (FISH OIL) 1000 MG CAPS Take by mouth.  Marland Kitchen omeprazole (PRILOSEC) 40 MG capsule Take 1 capsule (40 mg total) by mouth every morning.  . [DISCONTINUED] amLODipine-benazepril (LOTREL) 5-20 MG capsule Take 1 capsule by mouth 2 (two) times daily.  . [DISCONTINUED] bisoprolol-hydrochlorothiazide (ZIAC)  2.5-6.25 MG tablet Take 1 tablet by mouth daily.  . [DISCONTINUED] lovastatin (MEVACOR) 40 MG tablet TAKE 2 TABLETS BY MOUTH AT BEDTIME  . [DISCONTINUED] omeprazole (PRILOSEC) 40 MG capsule Take 40 mg by mouth every morning.   No facility-administered encounter medications on file as of 04/15/2019.     Surgical History: Past Surgical History:  Procedure Laterality Date  . APPENDECTOMY    . CHOLECYSTECTOMY    . CYST EXCISION     from chest  . EYE SURGERY     cataract  . MEMBRANE PEEL Right 01/02/2016   Procedure: MEMBRANE PEEL;  Surgeon: Milus Height, MD;  Location: [facility] ORS;  Service: Ophthalmology;  Laterality: Right;  . mole surgery     7 surgeries  . NOSE SURGERY    . PARS PLANA VITRECTOMY Right 08/01/2015   Procedure: PARS PLANA VITRECTOMY WITH 25 GAUGE,endolaser,gas exchange;  Surgeon: Milus Height, MD;  Location: [facility] ORS;  Service: Ophthalmology;  Laterality: Right;  casette lot [id number]      28% SF6  . PARS PLANA VITRECTOMY Right 01/02/2016   Procedure: PARS PLANA VITRECTOMY WITH 25 GAUGE, 14 % C3F8 gas exchange;  Surgeon: Milus Height, MD;  Location: [facility] ORS;  Service: Ophthalmology;  Laterality: Right;  fluid pack lot # [id number]   exp 08/17/2017  . PTERYGIA     X 2  . 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 Cataract   . Complication of anesthesia    AM DIZZINESS AFTER BOTH EYE SURGERIES  . GERD (gastroesophageal reflux disease)   . Hay fever   . Hypertension   . Seasonal allergies     Family History: Family History  Problem Relation Age of Onset  . Osteoarthritis Mother       Review of Systems  Constitutional: Negative for activity change, appetite change, chills, fatigue and unexpected weight change.   HENT: Negative for congestion, postnasal drip, rhinorrhea, sneezing and sore throat.   Eyes: Negative for redness.          Respiratory: Negative for cough, chest tightness, shortness of breath and wheezing.   Cardiovascular: Negative for chest pain and palpitations.       Elevated blood pressure today.  Gastrointestinal: Negative for abdominal pain, constipation, diarrhea, nausea and vomiting.  Endocrine: Negative for cold intolerance, heat intolerance, polydipsia and polyuria.  Musculoskeletal: Negative for arthralgias, back pain, joint swelling and neck pain.  Skin: Negative for rash.  Allergic/Immunologic: Negative for environmental allergies.  Neurological: Negative for dizziness, tremors, numbness and headaches.  Hematological: Negative for adenopathy. Does not bruise/bleed easily.  Psychiatric/Behavioral: Negative for behavioral problems (Depression), sleep disturbance and suicidal ideas. The patient is not nervous/anxious.      Today's Vitals   04/15/19 1404 04/15/19 1407  BP: (!) 152/75 (!) 142/78  Pulse: 81 60  Resp: 16   SpO2: 96% 96%  Weight: 211 lb (95.7 kg)   Height: 6' (1.829 m)    Body mass index is 28.62 kg/m.  Physical Exam Vitals signs and nursing note reviewed.  Constitutional:      General: He is not in acute distress.    Appearance: Normal appearance. He is well-developed. He is not diaphoretic.  HENT:     Head: Normocephalic and atraumatic.     Nose: Nose normal.     Mouth/Throat:     Pharynx: No oropharyngeal exudate.  Eyes:     Conjunctiva/sclera: Conjunctivae normal.     Pupils: Pupils are equal, round, and reactive to light.  Neck:     Musculoskeletal: Normal range of motion and neck supple.     Thyroid: No thyromegaly.     Vascular: No carotid bruit or JVD.     Trachea: No tracheal deviation.  Cardiovascular:     Rate and Rhythm: Normal rate and regular rhythm.     Pulses: Normal pulses.     Heart sounds: Normal heart sounds. No murmur.  No friction rub. No gallop.   Pulmonary:     Effort: Pulmonary effort is normal. No respiratory distress.     Breath sounds: Normal breath sounds. No wheezing or rales.  Chest:     Chest wall: No tenderness.  Abdominal:     General: Bowel sounds are normal.     Palpations: Abdomen is soft.     Tenderness: There is no abdominal tenderness.  Musculoskeletal: Normal range of motion.  Lymphadenopathy:     Cervical: No cervical adenopathy.  Skin:    General: Skin is warm and dry.     Capillary Refill: Capillary refill takes less than 2 seconds.  Neurological:     Mental Status: He is alert and oriented to person, place, and time.     Cranial Nerves: No cranial nerve deficit.  Psychiatric:        Behavior: Behavior normal.        Thought Content: Thought content  normal.        Judgment: Judgment normal.      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) Does the patient have difficulty concentrating, remembering, or making decisions?: No Does the patient have difficulty walking or climbing stairs?: No Does the patient have difficulty dressing or bathing?: No Does the patient have difficulty doing errands alone such as visiting a doctor's office or shopping?: No  MMSE - [place] Exam 04/15/2019  Orientation to time 5  Orientation to Place 5  Registration 3  Attention/ Calculation 5  Recall 3  Language- name 2 objects 2  Language- repeat 1  Language- follow 3 step command 3  Language- read & follow direction 1  Write a sentence 1  Copy design 1  Total score 30    Fall Risk  04/15/2019 10/11/2018 04/05/2018 03/16/2017  Falls in the past year? 0 0 No No  Comment - - - Emmi Telephone  Survey: data to providers prior to load    Assessment/Plan:  1. Encounter for general adult medical examination with abnormal findings Annual health maintenance exam today  2. Hypertension, unspecified type Stable. Continue bp medication as prescribed  - bisoprolol-hydrochlorothiazide (ZIAC) 2.5-6.25 MG tablet; Take 1 tablet by mouth daily.  Dispense: 90 tablet; Refill: 3 - amLODipine-benazepril (LOTREL) 5-20 MG capsule; Take 1 capsule by mouth 2 (two) times daily.  Dispense: 180 capsule; Refill: 3  3. Mixed hyperlipidemia Check fasting lipid panel prior to next visit and adjust dosing as indicated  - lovastatin (MEVACOR) 40 MG tablet; Take 2 tablets (80 mg total) by mouth at bedtime.  Dispense: 180 tablet; Refill: 3  4. Gastroesophageal reflux disease without esophagitis - omeprazole (PRILOSEC) 40 MG capsule; Take 1 capsule (40 mg total) by mouth every morning.  Dispense: 90 capsule; Refill: 3  5. Dysuria - Urinalysis, Routine w reflex microscopic   General Counseling: Akram verbalizes understanding of the findings of todays visit and agrees with plan of treatment. I have discussed any further diagnostic evaluation that may be needed or ordered today. We also reviewed his medications today. he has been encouraged to call the office with any questions or concerns that should arise related to todays visit.    Counseling:  Hypertension Counseling:   The following hypertensive lifestyle modification were recommended and discussed:  1. Limiting alcohol intake to less than 1 oz/day of ethanol:(24 oz of beer or 8 oz of wine or 2 oz of 100-proof whiskey). 2. Take baby ASA 81 mg daily. 3. Importance of regular aerobic exercise and losing weight. 4. Reduce dietary saturated fat and cholesterol intake for overall cardiovascular health. 5. Maintaining adequate dietary potassium, calcium, and magnesium intake. 6. Regular monitoring of the blood pressure. 7. Reduce sodium intake to less than  100 mmol/day (less than 2.3 gm of sodium or less than 6 gm of sodium choride)   This patient was seen by [place] with Dr Lavera Guise as a part of collaborative care agreement  Orders Placed This Encounter  Procedures  . Urinalysis, Routine w reflex microscopic    Meds ordered this encounter  Medications  . bisoprolol-hydrochlorothiazide (ZIAC) 2.5-6.25 MG tablet    Sig: Take 1  tablet by mouth daily.    Dispense:  90 tablet    Refill:  3    Order Specific Question:   Supervising Provider    Answer:   Lavera Guise [id number]  . amLODipine-benazepril (LOTREL) 5-20 MG capsule    Sig: Take 1 capsule by mouth 2 (two) times daily.    Dispense:  180 capsule    Refill:  3    Please d/c indivicual orders for amlodipine and benazapril.    Order Specific Question:   Supervising Provider    Answer:   Lavera Guise [id number]  . lovastatin (MEVACOR) 40 MG tablet    Sig: Take 2 tablets (80 mg total) by mouth at bedtime.    Dispense:  180 tablet    Refill:  3    Order Specific Question:   Supervising Provider    Answer:   Lavera Guise [id number]  . omeprazole (PRILOSEC) 40 MG capsule    Sig: Take 1 capsule (40 mg total) by mouth every morning.    Dispense:  90 capsule    Refill:  3    Order Specific Question:   Supervising Provider    Answer:   Lavera Guise [[id number]]    Time spent: [place], MD  Internal Medicine

## 2019-04-16 LAB — URINALYSIS, ROUTINE W REFLEX MICROSCOPIC
Bilirubin, UA: NEGATIVE
Glucose, UA: NEGATIVE
Ketones, UA: NEGATIVE
Leukocytes,UA: NEGATIVE
Nitrite, UA: NEGATIVE
Protein,UA: NEGATIVE
RBC, UA: NEGATIVE
Specific Gravity, UA: 1.018 (ref 1.005–1.030)
Urobilinogen, Ur: 0.2 mg/dL (ref 0.2–1.0)
pH, UA: 5 (ref 5.0–7.5)

## 2019-04-22 NOTE — Addendum Note (Signed)
Addended by: Leretha Pol on: 04/22/2019 09:48 AM   Modules accepted: Level of Service

## 2019-08-24 DIAGNOSIS — H6123 Impacted cerumen, bilateral: Secondary | ICD-10-CM | POA: Diagnosis not present

## 2019-08-24 DIAGNOSIS — H6063 Unspecified chronic otitis externa, bilateral: Secondary | ICD-10-CM | POA: Diagnosis not present

## 2019-10-10 ENCOUNTER — Other Ambulatory Visit: Payer: Self-pay | Admitting: Nurse Practitioner

## 2019-10-10 DIAGNOSIS — E782 Mixed hyperlipidemia: Secondary | ICD-10-CM | POA: Diagnosis not present

## 2019-10-10 DIAGNOSIS — Z0001 Encounter for general adult medical examination with abnormal findings: Secondary | ICD-10-CM | POA: Diagnosis not present

## 2019-10-10 DIAGNOSIS — R7301 Impaired fasting glucose: Secondary | ICD-10-CM | POA: Diagnosis not present

## 2019-10-10 DIAGNOSIS — Z125 Encounter for screening for malignant neoplasm of prostate: Secondary | ICD-10-CM | POA: Diagnosis not present

## 2019-10-10 DIAGNOSIS — I10 Essential (primary) hypertension: Secondary | ICD-10-CM | POA: Diagnosis not present

## 2019-10-11 LAB — COMPREHENSIVE METABOLIC PANEL
ALT: 39 IU/L (ref 0–44)
AST: 27 IU/L (ref 0–40)
Albumin/Globulin Ratio: 1.9 (ref 1.2–2.2)
Albumin: 4.7 g/dL (ref 3.7–4.7)
Alkaline Phosphatase: 85 IU/L (ref 39–117)
BUN/Creatinine Ratio: 10 (ref 10–24)
BUN: 11 mg/dL (ref 8–27)
Bilirubin Total: 0.8 mg/dL (ref 0.0–1.2)
CO2: 22 mmol/L (ref 20–29)
Calcium: 9.7 mg/dL (ref 8.6–10.2)
Chloride: 101 mmol/L (ref 96–106)
Creatinine, Ser: 1.07 mg/dL (ref 0.76–1.27)
GFR calc Af Amer: 78 mL/min/{1.73_m2} (ref >59)
GFR calc non Af Amer: 68 mL/min/{1.73_m2} (ref >59)
Globulin, Total: 2.5 g/dL (ref 1.5–4.5)
Glucose: 220 mg/dL — ABNORMAL HIGH (ref 65–99)
Potassium: 4.8 mmol/L (ref 3.5–5.2)
Sodium: 140 mmol/L (ref 134–144)
Total Protein: 7.2 g/dL (ref 6.0–8.5)

## 2019-10-11 LAB — PSA: Prostate Specific Ag, Serum: 2.9 ng/mL (ref 0.0–4.0)

## 2019-10-11 LAB — CBC
Hematocrit: 47.5 % (ref 37.5–51.0)
Hemoglobin: 16.7 g/dL (ref 13.0–17.7)
MCH: 33.1 pg — ABNORMAL HIGH (ref 26.6–33.0)
MCHC: 35.2 g/dL (ref 31.5–35.7)
MCV: 94 fL (ref 79–97)
Platelets: 256 10*3/uL (ref 150–450)
RBC: 5.05 x10E6/uL (ref 4.14–5.80)
RDW: 12.5 % (ref 11.6–15.4)
WBC: 7.9 10*3/uL (ref 3.4–10.8)

## 2019-10-11 LAB — HGB A1C W/O EAG: Hgb A1c MFr Bld: 7.9 % — ABNORMAL HIGH (ref 4.8–5.6)

## 2019-10-11 LAB — LIPID PANEL W/O CHOL/HDL RATIO
Cholesterol, Total: 182 mg/dL (ref 100–199)
HDL: 34 mg/dL — ABNORMAL LOW (ref >39)
LDL Chol Calc (NIH): 91 mg/dL (ref 0–99)
Triglycerides: 345 mg/dL — ABNORMAL HIGH (ref 0–149)
VLDL Cholesterol Cal: 57 mg/dL — ABNORMAL HIGH (ref 5–40)

## 2019-10-11 LAB — TSH: TSH: 1.49 u[IU]/mL (ref 0.450–4.500)

## 2019-10-11 LAB — T4, FREE: Free T4: 1.31 ng/dL (ref 0.82–1.77)

## 2019-10-12 NOTE — Progress Notes (Signed)
New diagnosis of diabetes. Comes in 10/18/2019. Also high triglycerides. Will discuss at visit.

## 2019-10-14 ENCOUNTER — Telehealth: Payer: Self-pay

## 2019-10-14 NOTE — Telephone Encounter (Signed)
NO ANSWER, NO VM UNABLE TO CONFIRM 10-18-19 OV.

## 2019-10-18 ENCOUNTER — Ambulatory Visit (INDEPENDENT_AMBULATORY_CARE_PROVIDER_SITE_OTHER): Payer: Medicare Other | Admitting: Nurse Practitioner

## 2019-10-18 ENCOUNTER — Other Ambulatory Visit: Payer: Self-pay

## 2019-10-18 ENCOUNTER — Encounter: Payer: Self-pay | Admitting: Nurse Practitioner

## 2019-10-18 VITALS — BP 152/67 | HR 74 | Temp 97.7°F | Resp 16 | Ht 72.0 in | Wt 213.6 lb

## 2019-10-18 DIAGNOSIS — E782 Mixed hyperlipidemia: Secondary | ICD-10-CM | POA: Diagnosis not present

## 2019-10-18 DIAGNOSIS — Z85828 Personal history of other malignant neoplasm of skin: Secondary | ICD-10-CM | POA: Diagnosis not present

## 2019-10-18 DIAGNOSIS — I10 Essential (primary) hypertension: Secondary | ICD-10-CM | POA: Diagnosis not present

## 2019-10-18 DIAGNOSIS — E1165 Type 2 diabetes mellitus with hyperglycemia: Secondary | ICD-10-CM | POA: Diagnosis not present

## 2019-10-18 DIAGNOSIS — Z8669 Personal history of other diseases of the nervous system and sense organs: Secondary | ICD-10-CM

## 2019-10-18 DIAGNOSIS — F17210 Nicotine dependence, cigarettes, uncomplicated: Secondary | ICD-10-CM

## 2019-10-18 MED ORDER — ACCU-CHEK SOFTCLIX LANCETS MISC: 1 refills | Status: DC

## 2019-10-18 MED ORDER — ACCU-CHEK GUIDE VI STRP: 1.0000 | ORAL_STRIP | 1 refills | Status: DC | PRN

## 2019-10-18 MED ORDER — METFORMIN HCL 500 MG PO TABS: 500.0000 mg | ORAL_TABLET | Freq: Two times a day (BID) | ORAL | 3 refills | Status: DC

## 2019-10-18 NOTE — Progress Notes (Signed)
[facility] [place], [place] [address]  Internal MEDICINE  Office Visit Note  Patient Name: Troy Baldwin  [id number]  [id number]  Date of Service: 10/19/2019  Chief Complaint  Patient presents with  . Hypertension  . Gastroesophageal Reflux    The patient is here for follow up visit. Blood pressure is mildly elevated. He recently had routine lab work done. His blood sugar was moderately elevated at 220 and his HgbA1c was 7.9. he is not currently diagnosed as diabetic. Triglycerides were also moderately elevated at 345 with normal LDL and total cholesterol  His PSA was normal. He states that he was not fasting for his labs. He is currently prescribed lovastatin 40mg , taking two tablets every evening. He states that he takes this as prescribed. He states that he feels well, overall. He has no chest pain, pressure, or shortness of breath. He has not noted increased thirst or need to urinate. States that he has gained some weight during the pandemic isolation. States that he continues to smoke.       Current Medication: Outpatient Encounter Medications as of 10/18/2019  Medication Sig  . amLODipine-benazepril (LOTREL) 5-20 MG capsule Take 1 capsule by mouth 2 (two) times daily.  . Ascorbic Acid (VITAMIN C) 1000 MG tablet Take 1,000 mg by mouth daily.  . bisoprolol-hydrochlorothiazide (ZIAC) 2.5-6.25 MG tablet Take 1 tablet by mouth daily.  . Calcium Carb-Cholecalciferol (CALTRATE 600+D3 SOFT PO) Take by mouth.  . Garlic 123XX123 MG CAPS Take by mouth.  . lovastatin (MEVACOR) 40 MG tablet Take 2 tablets (80 mg total) by mouth at bedtime.  . Omega-3 Fatty Acids (FISH OIL) 1000 MG CAPS Take by mouth.  Marland Kitchen omeprazole (PRILOSEC) 40 MG capsule Take 1 capsule (40 mg total) by mouth every morning.  . [DISCONTINUED] Accu-Chek Softclix Lancets lancets by Other route. Use as instructed once a daily e11.65  . [DISCONTINUED] glucose blood (ACCU-CHEK GUIDE) test strip 1 each by  Other route as needed for other. Use as instructed once a daily diag E11.65  . metFORMIN (GLUCOPHAGE) 500 MG tablet Take 1 tablet (500 mg total) by mouth 2 (two) times daily with a meal.   No facility-administered encounter medications on file as of 10/18/2019.    Surgical History: Past Surgical History:  Procedure Laterality Date  . APPENDECTOMY    . CHOLECYSTECTOMY    . CYST EXCISION     from chest  . EYE SURGERY     cataract  . MEMBRANE PEEL Right 01/02/2016   Procedure: MEMBRANE PEEL;  Surgeon: Milus Height, MD;  Location: [facility] OR[facility];  Service: Ophthalmology;  Laterality: Right;  . mole surgery     7 surgeries  . NOSE SURGERY    . PARS PLANA VITRECTOMY Right 08/01/2015   Procedure: PARS PLANA VITRECTOMY WITH 25 GAUGE,endolaser,gas exchange;  Surgeon: Milus Height, MD;  Location: [facility] OR[facility];  Service: Ophthalmology;  Laterality: Right;  casette lot [id number]      28% SF6  . PARS PLANA VITRECTOMY Right 01/02/2016   Procedure: PARS PLANA VITRECTOMY WITH 25 GAUGE, 14 % C3F8 gas exchange;  Surgeon: Milus Height, MD;  Location: [facility] OR[facility];  Service: Ophthalmology;  Laterality: Right;  fluid pack lot # [id number]   exp 08/17/2017  . PTERYGIA     X 2  . 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 Cataract   . Complication of anesthesia    AM DIZZINESS AFTER BOTH EYE SURGERIES  . GERD (gastroesophageal reflux disease)   . Hay fever   . Hypertension   . Seasonal allergies     Family History: Family History  Problem Relation Age of Onset  . Osteoarthritis Mother     Social History   Socioeconomic History  . Marital status: Married    Spouse name: Not on file  . Number of children: Not on file  . Years of education: Not on file  .  Highest education level: Not on file  Occupational History  . Not on file  Tobacco Use  . Smoking status: Current Some Day Smoker    Packs/day: 1.00    Types: Cigarettes  . Smokeless tobacco: Never Used  Substance and Sexual Activity  . Alcohol use: Yes    Alcohol/week: 5.0 standard drinks    Types: 5 Cans of beer per week    Comment: 5 beers a week/ occasionally  . Drug use: No  . Sexual activity: Not on file  Other Topics Concern  . Not on file  Social History Narrative  . Not on file   Social Determinants of Health   Financial Resource Strain:   . Difficulty of Paying Living Expenses: Not on file  Food Insecurity:   . Worried About Charity fundraiser in the Last Year: Not on file  . Ran Out of Food in the Last Year: Not on file  Transportation Needs:   . Lack of Transportation (Medical): Not on file  . Lack of Transportation (Non-Medical): Not on file  Physical Activity:   . Days of Exercise per Week: Not on file  . Minutes of Exercise per Session: Not on file  Stress:   . Feeling of Stress : Not on file  Social Connections:   . Frequency of Communication with Friends and Family: Not on file  . Frequency of Social Gatherings with Friends and Family: Not on file  . Attends Religious Services: Not on file  . Active Member of Clubs or Organizations: Not on file  . Attends Archivist Meetings: Not on file  . Marital Status: Not on file  Intimate Partner Violence:   . Fear of Current or Ex-Partner: Not on file  . Emotionally Abused: Not on file  . Physically Abused: Not on file  . Sexually Abused: Not on file      Review of Systems  Constitutional: Positive for fatigue. Negative for activity change, appetite change, chills and unexpected weight change.       States that he has had unwanted weight gain over the past few months due to pandemic isolation.   HENT: Negative for congestion, postnasal drip, rhinorrhea, sneezing and sore throat.   Respiratory:  Negative for cough, chest tightness, shortness of breath and wheezing.   Cardiovascular: Negative for chest pain and palpitations.       Elevated blood pressure today.  Gastrointestinal: Negative for abdominal pain, constipation, diarrhea, nausea and vomiting.  Endocrine: Negative for cold intolerance, heat intolerance, polydipsia and polyuria.       Blood sugar elevated with HgbA1c of 7.9 on recent lab draw.  Musculoskeletal: Negative for arthralgias, back pain, joint swelling and neck pain.  Skin: Negative for rash.  Allergic/Immunologic: Negative for environmental allergies.  Neurological: Negative for dizziness, tremors, numbness and headaches.  Hematological: Negative for adenopathy. Does not bruise/bleed easily.  Psychiatric/Behavioral: Negative for behavioral problems (Depression), sleep disturbance and suicidal ideas. The patient is nervous/anxious.    Today's Vitals   10/18/19 1353  BP: (!) 152/67  Pulse: 74  Resp: 16  Temp: 97.7 F (36.5 C)  SpO2: 93%  Weight: 213 lb 9.6 oz (96.9 kg)  Height: 6' (1.829 m)   Body mass index is 28.97 kg/m.  Physical Exam Vitals and nursing note reviewed.  Constitutional:      General: He is not in acute distress.    Appearance: Normal appearance. He is well-developed. He is not diaphoretic.  HENT:     Head: Normocephalic and atraumatic.     Nose: Nose normal.     Mouth/Throat:     Pharynx: No oropharyngeal exudate.  Eyes:     Pupils: Pupils are equal, round, and reactive to light.  Neck:     Thyroid: No thyromegaly.     Vascular: No carotid bruit or JVD.     Trachea: No tracheal deviation.  Cardiovascular:     Rate and Rhythm: Normal rate and regular rhythm.     Heart sounds: Normal heart sounds. No murmur. No friction rub. No gallop.   Pulmonary:     Effort: Pulmonary effort is normal. No respiratory distress.     Breath sounds: Normal breath sounds. No wheezing or rales.  Chest:     Chest wall: No tenderness.  Abdominal:      General: Bowel sounds are normal.     Palpations: Abdomen is soft.     Tenderness: There is no abdominal tenderness.  Musculoskeletal:        General: Normal range of motion.     Cervical back: Normal range of motion and neck supple.  Lymphadenopathy:     Cervical: No cervical adenopathy.  Skin:    General: Skin is warm and dry.  Neurological:     Mental Status: He is alert and oriented to person, place, and time.     Cranial Nerves: No cranial nerve deficit.  Psychiatric:        Mood and Affect: Mood normal.        Behavior: Behavior normal.        Thought Content: Thought content normal.        Judgment: Judgment normal.    Assessment/Plan:  1. Type 2 diabetes mellitus with hyperglycemia, without long-term current use of insulin (HCC) Glucose 220 with HgbA1c 7.9 on labs. Start metformin 500mg  twice daily. Written information provided regarding diabetic diet and healthy lifestyle habits to help lower blood sugar. Patient given Accu-Check glucometer and advised to check blood sugar daily, prior to eating. He should bring glucometer with him to next visit to review trends. He was given demonstration on proper way to check blood sugar. He voiced understanding.  - metFORMIN (GLUCOPHAGE) 500 MG tablet; Take 1 tablet (500 mg total) by mouth 2 (two) times daily with a meal.  Dispense: 60 tablet; Refill: 3  2. Essential hypertension Generally stable. Continue bp medication as prescribed   3. Mixed hyperlipidemia Moderate elevation of triglycerides with recent labs. Patient was not fasting and has new diagnosis of Type 2 diabetes. Continue lovastatin 80mg  daily. Will monitor closely.   4. Personal history of skin cancer Refer to dermatology.  - Ambulatory referral to Dermatology  5. History of retinal detachment Refer to ophthalmologist for continued management.  - Ambulatory referral to Ophthalmology  6. Cigarette smoker Discussed benefits of smoking cessation. Patient not  currently ready to quit smoking at  this time.   General Counseling: georg fons understanding of the findings of todays visit and agrees with plan of treatment. I have discussed any further diagnostic evaluation that may be needed or ordered today. We also reviewed his medications today. he has been encouraged to call the office with any questions or concerns that should arise related to todays visit.  Diabetes Counseling:  1. Addition of ACE inh/ ARB'S for nephroprotection. Microalbumin is updated  2. Diabetic foot care, prevention of complications. Podiatry consult 3. Exercise and lose weight.  4. Diabetic eye examination, Diabetic eye exam is updated  5. Monitor blood sugar closlely. nutrition counseling.  6. Sign and symptoms of hypoglycemia including shaking sweating,confusion and headaches.  This patient was seen by Leretha Pol FNP Collaboration with Dr Lavera Guise as a part of collaborative care agreement  Orders Placed This Encounter  Procedures  . Ambulatory referral to Dermatology  . Ambulatory referral to Ophthalmology    Meds ordered this encounter  Medications  . metFORMIN (GLUCOPHAGE) 500 MG tablet    Sig: Take 1 tablet (500 mg total) by mouth 2 (two) times daily with a meal.    Dispense:  60 tablet    Refill:  3    Order Specific Question:   Supervising Provider    Answer:   Lavera Guise [id number]    Total time spent: 23 Minutes  Time spent includes review of chart, medications, test results, and follow up plan with the patient.      Dr Lavera Guise Internal medicine

## 2019-10-19 DIAGNOSIS — E1165 Type 2 diabetes mellitus with hyperglycemia: Secondary | ICD-10-CM | POA: Insufficient documentation

## 2019-10-19 DIAGNOSIS — E782 Mixed hyperlipidemia: Secondary | ICD-10-CM | POA: Insufficient documentation

## 2019-10-19 DIAGNOSIS — Z8669 Personal history of other diseases of the nervous system and sense organs: Secondary | ICD-10-CM | POA: Insufficient documentation

## 2019-10-19 DIAGNOSIS — Z85828 Personal history of other malignant neoplasm of skin: Secondary | ICD-10-CM | POA: Insufficient documentation

## 2019-11-10 ENCOUNTER — Telehealth: Payer: Self-pay

## 2019-11-10 NOTE — Telephone Encounter (Signed)
CONFIRMED AND SCREENED FOR 11-15-19 OV.

## 2019-11-11 DIAGNOSIS — L821 Other seborrheic keratosis: Secondary | ICD-10-CM | POA: Diagnosis not present

## 2019-11-11 DIAGNOSIS — Z08 Encounter for follow-up examination after completed treatment for malignant neoplasm: Secondary | ICD-10-CM | POA: Diagnosis not present

## 2019-11-11 DIAGNOSIS — Z85828 Personal history of other malignant neoplasm of skin: Secondary | ICD-10-CM | POA: Diagnosis not present

## 2019-11-11 DIAGNOSIS — D2261 Melanocytic nevi of right upper limb, including shoulder: Secondary | ICD-10-CM | POA: Diagnosis not present

## 2019-11-11 DIAGNOSIS — D2271 Melanocytic nevi of right lower limb, including hip: Secondary | ICD-10-CM | POA: Diagnosis not present

## 2019-11-11 DIAGNOSIS — D485 Neoplasm of uncertain behavior of skin: Secondary | ICD-10-CM | POA: Diagnosis not present

## 2019-11-11 DIAGNOSIS — C44311 Basal cell carcinoma of skin of nose: Secondary | ICD-10-CM | POA: Diagnosis not present

## 2019-11-11 DIAGNOSIS — Z85820 Personal history of malignant melanoma of skin: Secondary | ICD-10-CM | POA: Diagnosis not present

## 2019-11-15 ENCOUNTER — Ambulatory Visit (INDEPENDENT_AMBULATORY_CARE_PROVIDER_SITE_OTHER): Payer: Medicare Other | Admitting: Nurse Practitioner

## 2019-11-15 ENCOUNTER — Other Ambulatory Visit: Payer: Self-pay

## 2019-11-15 ENCOUNTER — Encounter: Payer: Self-pay | Admitting: Nurse Practitioner

## 2019-11-15 VITALS — BP 138/64 | HR 75 | Temp 97.9°F | Resp 16 | Ht 72.0 in | Wt 199.4 lb

## 2019-11-15 DIAGNOSIS — I10 Essential (primary) hypertension: Secondary | ICD-10-CM | POA: Diagnosis not present

## 2019-11-15 DIAGNOSIS — E1165 Type 2 diabetes mellitus with hyperglycemia: Secondary | ICD-10-CM | POA: Diagnosis not present

## 2019-11-15 NOTE — Progress Notes (Signed)
[facility] [place], [place] [address]  Internal MEDICINE  Office Visit Note  Patient Name: Troy Baldwin  [id number]  [id number]  Date of Service: 11/30/2019  Chief Complaint  Patient presents with  . Diabetes    The patient is here for follow up visit. He was diagnosed with Type 2 diabetes at his most recent visit. Was prescribed metformin 500mg  twice daily. Was given education regarding diet and lifestyle modifications to help lower blood sugars. Since this visit, he has made significant diet changes. Is eating much more fruit and vegetables. He has increased his protein intake and has significantly reduced his intake of carbohydrates and sugar. He is exercising everyday. He has been checking his blood sugars daily. tye have trended downward since the day of his diagnosis. Blood sugars are now running around 90. He states that he feels better than he has in a very long time. He has lost 14 pounds since he was last seen. He has not started on metformin or any other new medications.       Current Medication: Outpatient Encounter Medications as of 11/15/2019  Medication Sig  . Accu-Chek Softclix Lancets lancets Use as instructed once a daily e11.65  . amLODipine-benazepril (LOTREL) 5-20 MG capsule Take 1 capsule by mouth 2 (two) times daily.  . Ascorbic Acid (VITAMIN C) 1000 MG tablet Take 1,000 mg by mouth daily.  . bisoprolol-hydrochlorothiazide (ZIAC) 2.5-6.25 MG tablet Take 1 tablet by mouth daily.  . Calcium Carb-Cholecalciferol (CALTRATE 600+D3 SOFT PO) Take by mouth.  . Garlic 123XX123 MG CAPS Take by mouth.  Marland Kitchen glucose blood (ACCU-CHEK GUIDE) test strip 1 each by Other route as needed for other. Use as instructed once a daily diag E11.65  . lovastatin (MEVACOR) 40 MG tablet Take 2 tablets (80 mg total) by mouth at bedtime.  . Omega-3 Fatty Acids (FISH OIL) 1000 MG CAPS Take by mouth.  Marland Kitchen omeprazole (PRILOSEC) 40 MG capsule Take 1 capsule (40 mg total) by  mouth every morning.  . metFORMIN (GLUCOPHAGE) 500 MG tablet Take 1 tablet (500 mg total) by mouth 2 (two) times daily with a meal. (Patient not taking: Reported on 11/15/2019)   No facility-administered encounter medications on file as of 11/15/2019.    Surgical History: Past Surgical History:  Procedure Laterality Date  . APPENDECTOMY    . CHOLECYSTECTOMY    . CYST EXCISION     from chest  . EYE SURGERY     cataract  . MEMBRANE PEEL Right 01/02/2016   Procedure: MEMBRANE PEEL;  Surgeon: Milus Height, MD;  Location: [facility];  Service: Ophthalmology;  Laterality: Right;  . mole surgery     7 surgeries  . NOSE SURGERY    . PARS PLANA VITRECTOMY Right 08/01/2015   Procedure: PARS PLANA VITRECTOMY WITH 25 GAUGE,endolaser,gas exchange;  Surgeon: Milus Height, MD;  Location: [facility];  Service: Ophthalmology;  Laterality: Right;  casette lot [id number]      28% SF6  . PARS PLANA VITRECTOMY Right 01/02/2016   Procedure: PARS PLANA VITRECTOMY WITH 25 GAUGE, 14 % C3F8 gas exchange;  Surgeon: Milus Height, MD;  Location: [facility] OR[facility];  Service: Ophthalmology;  Laterality: Right;  fluid pack lot # [id number]   exp 08/17/2017  . PTERYGIA     X 2  . 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 Cataract   . Complication of anesthesia    AM DIZZINESS AFTER BOTH EYE SURGERIES  . GERD (gastroesophageal reflux disease)   . Hay fever   . Hypertension   . Seasonal allergies     Family History: Family History  Problem Relation Age of Onset  . Osteoarthritis Mother     Social History   Socioeconomic History  . Marital status: Married    Spouse name: Not on file  . Number of children: Not on file  . Years of education: Not on file  . Highest  education level: Not on file  Occupational History  . Not on file  Tobacco Use  . Smoking status: Current Some Day Smoker    Packs/day: 1.00    Types: Cigarettes  . Smokeless tobacco: Never Used  Substance and Sexual Activity  . Alcohol use: Not Currently  . Drug use: No  . Sexual activity: Not on file  Other Topics Concern  . Not on file  Social History Narrative  . Not on file   Social Determinants of Health   Financial Resource Strain:   . Difficulty of Paying Living Expenses:   Food Insecurity:   . Worried About Charity fundraiser in the Last Year:   . Arboriculturist in the Last Year:   Transportation Needs:   . Film/video editor (Medical):   Marland Kitchen Lack of Transportation (Non-Medical):   Physical Activity:   . Days of Exercise per Week:   . Minutes of Exercise per Session:   Stress:   . Feeling of Stress :   Social Connections:   . Frequency of Communication with Friends and Family:   . Frequency of Social Gatherings with Friends and Family:   . Attends Religious Services:   . Active Member of Clubs or Organizations:   . Attends Archivist Meetings:   Marland Kitchen Marital Status:   Intimate Partner Violence:   . Fear of Current or Ex-Partner:   . Emotionally Abused:   Marland Kitchen Physically Abused:   . Sexually Abused:       Review of Systems  Constitutional: Positive for fatigue. Negative for activity change, appetite change, chills and unexpected weight change.       Patient has lost 14 pounds since his last visit   HENT: Negative for congestion, postnasal drip, rhinorrhea, sneezing and sore throat.   Respiratory: Negative for cough, chest tightness, shortness of breath and wheezing.   Cardiovascular: Negative for chest pain and palpitations.       Improved blood pressure.  Gastrointestinal: Negative for abdominal pain, constipation, diarrhea, nausea and vomiting.  Endocrine: Negative for cold intolerance, heat intolerance, polydipsia and polyuria.       Blood  sugar log shows trend downward. Blood sugars are now running about 90.  Musculoskeletal: Negative for arthralgias, back pain, joint swelling and neck pain.  Skin: Negative for rash.  Allergic/Immunologic: Negative for environmental allergies.  Neurological: Negative for dizziness, tremors, numbness and headaches.  Hematological: Negative for adenopathy. Does not bruise/bleed easily.  Psychiatric/Behavioral: Negative for behavioral problems (Depression), sleep disturbance and suicidal ideas. The patient is not nervous/anxious.     Today's Vitals   11/15/19 1452  BP: 138/64  Pulse: 75  Resp: 16  Temp: 97.9 F (36.6 C)  SpO2: 97%  Weight: 199 lb 6.4 oz (90.4 kg)  Height: 6' (1.829 m)   Body mass index is 27.04  kg/m.  Physical Exam Vitals and nursing note reviewed.  Constitutional:      General: He is not in acute distress.    Appearance: Normal appearance. He is well-developed. He is not diaphoretic.  HENT:     Head: Normocephalic and atraumatic.     Nose: Nose normal.     Mouth/Throat:     Pharynx: No oropharyngeal exudate.  Eyes:     Pupils: Pupils are equal, round, and reactive to light.  Neck:     Thyroid: No thyromegaly.     Vascular: No carotid bruit or JVD.     Trachea: No tracheal deviation.  Cardiovascular:     Rate and Rhythm: Normal rate and regular rhythm.     Heart sounds: Normal heart sounds. No murmur. No friction rub. No gallop.   Pulmonary:     Effort: Pulmonary effort is normal. No respiratory distress.     Breath sounds: Normal breath sounds. No wheezing or rales.  Chest:     Chest wall: No tenderness.  Abdominal:     General: Bowel sounds are normal.     Palpations: Abdomen is soft.     Tenderness: There is no abdominal tenderness.  Musculoskeletal:        General: Normal range of motion.     Cervical back: Normal range of motion and neck supple.  Lymphadenopathy:     Cervical: No cervical adenopathy.  Skin:    General: Skin is warm and dry.   Neurological:     Mental Status: He is alert and oriented to person, place, and time.     Cranial Nerves: No cranial nerve deficit.  Psychiatric:        Mood and Affect: Mood normal.        Behavior: Behavior normal.        Thought Content: Thought content normal.        Judgment: Judgment normal.    Assessment/Plan: 1. Type 2 diabetes mellitus with hyperglycemia, without long-term current use of insulin (HCC) Blood sugar log reviewed with the patient. Sugars have trended in downward direction. Now, fasting blood sugars are generally running around 90. Currently not taking medication to help with blood sugar. Advised he continue with diet and lifestyle modifications to help control blood sugars. Monitor blood sugars everyday. Notify office of changes.   2. Essential hypertension Stable. Continue bp medication as prescribed.   General Counseling: nasser blatz understanding of the findings of todays visit and agrees with plan of treatment. I have discussed any further diagnostic evaluation that may be needed or ordered today. We also reviewed his medications today. he has been encouraged to call the office with any questions or concerns that should arise related to todays visit.   Diabetes Counseling:  1. Addition of ACE inh/ ARB'S for nephroprotection. Microalbumin is updated  2. Diabetic foot care, prevention of complications. Podiatry consult 3. Exercise and lose weight.  4. Diabetic eye examination, Diabetic eye exam is updated  5. Monitor blood sugar closlely. nutrition counseling.  6. Sign and symptoms of hypoglycemia including shaking sweating,confusion and headaches.  This patient was seen by Leretha Pol FNP Collaboration with Dr Lavera Guise as a part of collaborative care agreement  Total time spent: 30 Minutes   Time spent includes review of chart, medications, test results, and follow up plan with the patient.      Dr Lavera Guise Internal medicine

## 2019-12-06 DIAGNOSIS — Z961 Presence of intraocular lens: Secondary | ICD-10-CM | POA: Diagnosis not present

## 2019-12-06 DIAGNOSIS — H3321 Serous retinal detachment, right eye: Secondary | ICD-10-CM | POA: Diagnosis not present

## 2019-12-06 DIAGNOSIS — H4312 Vitreous hemorrhage, left eye: Secondary | ICD-10-CM | POA: Diagnosis not present

## 2020-01-05 DIAGNOSIS — M950 Acquired deformity of nose: Secondary | ICD-10-CM | POA: Diagnosis not present

## 2020-01-05 DIAGNOSIS — L814 Other melanin hyperpigmentation: Secondary | ICD-10-CM | POA: Diagnosis not present

## 2020-01-05 DIAGNOSIS — C44311 Basal cell carcinoma of skin of nose: Secondary | ICD-10-CM | POA: Diagnosis not present

## 2020-01-05 DIAGNOSIS — L578 Other skin changes due to chronic exposure to nonionizing radiation: Secondary | ICD-10-CM | POA: Diagnosis not present

## 2020-01-05 DIAGNOSIS — L988 Other specified disorders of the skin and subcutaneous tissue: Secondary | ICD-10-CM | POA: Diagnosis not present

## 2020-01-30 ENCOUNTER — Other Ambulatory Visit: Payer: Self-pay

## 2020-01-30 MED ORDER — ACCU-CHEK GUIDE VI STRP: 1.0000 | ORAL_STRIP | 1 refills | Status: DC | PRN

## 2020-02-07 ENCOUNTER — Other Ambulatory Visit: Payer: Self-pay | Admitting: Nurse Practitioner

## 2020-02-08 LAB — BASIC METABOLIC PANEL
BUN/Creatinine Ratio: 14 (ref 10–24)
BUN: 14 mg/dL (ref 8–27)
CO2: 23 mmol/L (ref 20–29)
Calcium: 9.8 mg/dL (ref 8.6–10.2)
Chloride: 101 mmol/L (ref 96–106)
Creatinine, Ser: 1.02 mg/dL (ref 0.76–1.27)
GFR calc Af Amer: 83 mL/min/{1.73_m2} (ref >59)
GFR calc non Af Amer: 72 mL/min/{1.73_m2} (ref >59)
Glucose: 108 mg/dL — ABNORMAL HIGH (ref 65–99)
Potassium: 4.4 mmol/L (ref 3.5–5.2)
Sodium: 139 mmol/L (ref 134–144)

## 2020-02-08 LAB — LIPID PANEL W/O CHOL/HDL RATIO
Cholesterol, Total: 171 mg/dL (ref 100–199)
HDL: 42 mg/dL (ref >39)
LDL Chol Calc (NIH): 108 mg/dL — ABNORMAL HIGH (ref 0–99)
Triglycerides: 113 mg/dL (ref 0–149)
VLDL Cholesterol Cal: 21 mg/dL (ref 5–40)

## 2020-02-09 NOTE — Progress Notes (Signed)
Labs incredible. Discuss at visit 02/14/2020

## 2020-02-10 ENCOUNTER — Telehealth: Payer: Self-pay

## 2020-02-10 NOTE — Telephone Encounter (Signed)
No vm unable to confirm or screen for 02-14-20 ov.

## 2020-02-14 ENCOUNTER — Ambulatory Visit (INDEPENDENT_AMBULATORY_CARE_PROVIDER_SITE_OTHER): Payer: Medicare Other | Admitting: Nurse Practitioner

## 2020-02-14 ENCOUNTER — Other Ambulatory Visit: Payer: Self-pay

## 2020-02-14 ENCOUNTER — Encounter: Payer: Self-pay | Admitting: Nurse Practitioner

## 2020-02-14 VITALS — BP 139/67 | HR 71 | Temp 97.5°F | Resp 16 | Ht 72.0 in | Wt 186.0 lb

## 2020-02-14 DIAGNOSIS — I10 Essential (primary) hypertension: Secondary | ICD-10-CM | POA: Diagnosis not present

## 2020-02-14 DIAGNOSIS — E782 Mixed hyperlipidemia: Secondary | ICD-10-CM | POA: Diagnosis not present

## 2020-02-14 DIAGNOSIS — R7303 Prediabetes: Secondary | ICD-10-CM

## 2020-02-14 LAB — POCT GLYCOSYLATED HEMOGLOBIN (HGB A1C): Hemoglobin A1C: 5.8 % — AB (ref 4.0–5.6)

## 2020-02-14 NOTE — Progress Notes (Signed)
[facility] [place], [place] [address]  Internal MEDICINE  Office Visit Note  Patient Name: Troy Baldwin  [id number]  [id number]  Date of Service: 02/20/2020  Chief Complaint  Patient presents with  . Follow-up  . Gastroesophageal Reflux  . Hypertension  . Quality Metric Gaps    Foot Exam    The patient is here for routine follow up visit. He was recently diagnosed with Type 2 diabetes. Was prescribed metformin 500mg  twice daily. Was given education regarding diet and lifestyle modifications to help lower blood sugars. Since this visit, he has made significant diet changes. Is eating much more fruit and vegetables. He has increased his protein intake and has significantly reduced his intake of carbohydrates and sugar. He is exercising everyday. He has been checking his blood sugars daily. tye have trended downward since the day of his diagnosis. Blood sugars are now running around 90. He states that he feels better than he has in a very long time. The patient has lost 27 pounds since he was initially diagnosed with type 2 diabetes. He has not yet started metformin. His HgbA1c is 5.8 today,  Down from 7.9 when he was initially diagnosed.  The patient also had cholesterol panel rechecked. Levels are much improved. His total cholesterol went from 182 to 171, triglycerides from 345 to 113, HDL 34 to 42, and LDL from 91 to 108.       Current Medication: Outpatient Encounter Medications as of 02/14/2020  Medication Sig Note  . Accu-Chek Softclix Lancets lancets Use as instructed once a daily e11.65   . amLODipine-benazepril (LOTREL) 5-20 MG capsule Take 1 capsule by mouth 2 (two) times daily.   . Ascorbic Acid (VITAMIN C) 1000 MG tablet Take 1,000 mg by mouth daily.   . bisoprolol-hydrochlorothiazide (ZIAC) 2.5-6.25 MG tablet Take 1 tablet by mouth daily.   . Calcium Carb-Cholecalciferol (CALTRATE 600+D3 SOFT PO) Take by mouth.   . Garlic 8101 MG CAPS Take by  mouth.   Marland Kitchen glucose blood (ACCU-CHEK GUIDE) test strip 1 each by Other route as needed for other. Use as instructed once a daily diag E11.65   . lovastatin (MEVACOR) 40 MG tablet Take 2 tablets (80 mg total) by mouth at bedtime.   . Omega-3 Fatty Acids (FISH OIL) 1000 MG CAPS Take by mouth.   Marland Kitchen omeprazole (PRILOSEC) 40 MG capsule Take 1 capsule (40 mg total) by mouth every morning.   . [DISCONTINUED] metFORMIN (GLUCOPHAGE) 500 MG tablet Take 1 tablet (500 mg total) by mouth 2 (two) times daily with a meal. 02/14/2020: patient doing well without medication.    No facility-administered encounter medications on file as of 02/14/2020.    Surgical History: Past Surgical History:  Procedure Laterality Date  . APPENDECTOMY    . CHOLECYSTECTOMY    . CYST EXCISION     from chest  . EYE SURGERY     cataract  . MEMBRANE PEEL Right 01/02/2016   Procedure: MEMBRANE PEEL;  Surgeon: Milus Height, MD;  Location: [facility];  Service: Ophthalmology;  Laterality: Right;  . mole surgery     7 surgeries  . NOSE SURGERY    . PARS PLANA VITRECTOMY Right 08/01/2015   Procedure: PARS PLANA VITRECTOMY WITH 25 GAUGE,endolaser,gas exchange;  Surgeon: Milus Height, MD;  Location: [facility] OR[facility];  Service: Ophthalmology;  Laterality: Right;  casette lot #[id number]      28% SF6  . PARS PLANA VITRECTOMY Right 01/02/2016   Procedure: PARS PLANA  VITRECTOMY WITH 25 GAUGE, 14 % C3F8 gas exchange;  Surgeon: Milus Height, MD;  Location: [facility] OR[facility];  Service: Ophthalmology;  Laterality: Right;  fluid pack lot # [id number]   exp 08/17/2017  . PTERYGIA     X 2  . 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 Cataract   . Complication of anesthesia    AM DIZZINESS AFTER BOTH EYE SURGERIES  .  GERD (gastroesophageal reflux disease)   . Hay fever   . Hypertension   . Seasonal allergies     Family History: Family History  Problem Relation Age of Onset  . Osteoarthritis Mother     Social History   Socioeconomic History  . Marital status: Married    Spouse name: Not on file  . Number of children: Not on file  . Years of education: Not on file  . Highest education level: Not on file  Occupational History  . Not on file  Tobacco Use  . Smoking status: Current Some Day Smoker    Packs/day: 1.50    Types: Cigarettes  . Smokeless tobacco: Never Used  Substance and Sexual Activity  . Alcohol use: Not Currently  . Drug use: No  . Sexual activity: Not on file  Other Topics Concern  . Not on file  Social History Narrative  . Not on file   Social Determinants of Health   Financial Resource Strain:   . Difficulty of Paying Living Expenses:   Food Insecurity:   . Worried About Charity fundraiser in the Last Year:   . Arboriculturist in the Last Year:   Transportation Needs:   . Film/video editor (Medical):   Marland Kitchen Lack of Transportation (Non-Medical):   Physical Activity:   . Days of Exercise per Week:   . Minutes of Exercise per Session:   Stress:   . Feeling of Stress :   Social Connections:   . Frequency of Communication with Friends and Family:   . Frequency of Social Gatherings with Friends and Family:   . Attends Religious Services:   . Active Member of Clubs or Organizations:   . Attends Archivist Meetings:   Marland Kitchen Marital Status:   Intimate Partner Violence:   . Fear of Current or Ex-Partner:   . Emotionally Abused:   Marland Kitchen Physically Abused:   . Sexually Abused:       Review of Systems  Constitutional: Positive for fatigue. Negative for activity change, appetite change, chills and unexpected weight change.       Patient has lost 27 pounds since his initial diagnosis of type 2 diabetes.   HENT: Negative for congestion, postnasal drip,  rhinorrhea, sneezing and sore throat.   Respiratory: Negative for cough, chest tightness, shortness of breath and wheezing.   Cardiovascular: Negative for chest pain and palpitations.       Improved blood pressure.  Gastrointestinal: Negative for abdominal pain, constipation, diarrhea, nausea and vomiting.  Endocrine: Negative for cold intolerance, heat intolerance, polydipsia and polyuria.       Blood sugars are doing very well with diet and lifestyle changes alone.   Musculoskeletal: Negative for arthralgias, back pain, joint swelling and neck pain.  Skin: Negative for rash.  Allergic/Immunologic: Negative for environmental allergies.  Neurological: Negative  for dizziness, tremors, numbness and headaches.  Hematological: Negative for adenopathy. Does not bruise/bleed easily.  Psychiatric/Behavioral: Negative for behavioral problems (Depression), sleep disturbance and suicidal ideas. The patient is not nervous/anxious.     Today's Vitals   02/14/20 1431  BP: 139/67  Pulse: 71  Resp: 16  Temp: (!) 97.5 F (36.4 C)  SpO2: 98%  Weight: 186 lb (84.4 kg)  Height: 6' (1.829 m)   Body mass index is 25.23 kg/m.  Physical Exam Vitals and nursing note reviewed.  Constitutional:      General: He is not in acute distress.    Appearance: Normal appearance. He is well-developed. He is not diaphoretic.  HENT:     Head: Normocephalic and atraumatic.     Mouth/Throat:     Pharynx: No oropharyngeal exudate.  Eyes:     Pupils: Pupils are equal, round, and reactive to light.  Neck:     Thyroid: No thyromegaly.     Vascular: No JVD.     Trachea: No tracheal deviation.  Cardiovascular:     Rate and Rhythm: Normal rate and regular rhythm.     Heart sounds: Normal heart sounds. No murmur heard.  No friction rub. No gallop.   Pulmonary:     Effort: Pulmonary effort is normal. No respiratory distress.     Breath sounds: Normal breath sounds. No wheezing or rales.  Chest:     Chest wall: No  tenderness.  Abdominal:     Palpations: Abdomen is soft.  Musculoskeletal:        General: Normal range of motion.     Cervical back: Normal range of motion and neck supple.  Lymphadenopathy:     Cervical: No cervical adenopathy.  Skin:    General: Skin is warm and dry.  Neurological:     Mental Status: He is alert and oriented to person, place, and time.     Cranial Nerves: No cranial nerve deficit.  Psychiatric:        Mood and Affect: Mood normal.        Behavior: Behavior normal.        Thought Content: Thought content normal.        Judgment: Judgment normal.   Assessment/Plan:  1. Borderline diabetes - POCT HgB A1C 5.8 today. He should continue all diet and lifestyle changes made to control blood sugars. Will monitor closely.   2. Mixed hyperlipidemia Reviewed labs showing improved cholesterol panel. Continue atorvastatin as prescribed. Will monitor closely   3. Essential hypertension Stable. Continue bp medication as prescribed.   General Counseling: otha monical understanding of the findings of todays visit and agrees with plan of treatment. I have discussed any further diagnostic evaluation that may be needed or ordered today. We also reviewed his medications today. he has been encouraged to call the office with any questions or concerns that should arise related to todays visit.  Diabetes Counseling:  1. Addition of ACE inh/ ARB'S for nephroprotection. Microalbumin is updated  2. Diabetic foot care, prevention of complications. Podiatry consult 3. Exercise and lose weight.  4. Diabetic eye examination, Diabetic eye exam is updated  5. Monitor blood sugar closlely. nutrition counseling.  6. Sign and symptoms of hypoglycemia including shaking sweating,confusion and headaches.  This patient was seen by Leretha Pol FNP Collaboration with Dr Lavera Guise as a part of collaborative care agreement  Orders Placed This Encounter  Procedures  . POCT HgB A1C       Total time spent:  30 Minutes   Time spent includes review of chart, medications, test results, and follow up plan with the patient.      Dr Lavera Guise Internal medicine

## 2020-02-15 NOTE — Progress Notes (Signed)
Discussed with patient during visit 02/14/2020

## 2020-05-15 ENCOUNTER — Other Ambulatory Visit: Payer: Self-pay

## 2020-05-15 ENCOUNTER — Ambulatory Visit (INDEPENDENT_AMBULATORY_CARE_PROVIDER_SITE_OTHER): Payer: Medicare Other | Admitting: Nurse Practitioner

## 2020-05-15 ENCOUNTER — Encounter: Payer: Self-pay | Admitting: Nurse Practitioner

## 2020-05-15 VITALS — BP 143/71 | HR 66 | Temp 97.7°F | Resp 16 | Ht 72.0 in | Wt 184.0 lb

## 2020-05-15 DIAGNOSIS — E782 Mixed hyperlipidemia: Secondary | ICD-10-CM

## 2020-05-15 DIAGNOSIS — R7303 Prediabetes: Secondary | ICD-10-CM | POA: Diagnosis not present

## 2020-05-15 DIAGNOSIS — F17210 Nicotine dependence, cigarettes, uncomplicated: Secondary | ICD-10-CM

## 2020-05-15 DIAGNOSIS — I10 Essential (primary) hypertension: Secondary | ICD-10-CM

## 2020-05-15 LAB — POCT GLYCOSYLATED HEMOGLOBIN (HGB A1C): Hemoglobin A1C: 5.9 % — AB (ref 4.0–5.6)

## 2020-05-15 NOTE — Progress Notes (Signed)
[facility] [place], [place] [address]  Internal MEDICINE  Office Visit Note  Patient Name: Troy Baldwin  [id number]  [id number]  Date of Service: 06/03/2020  Chief Complaint  Patient presents with  . Gastroesophageal Reflux  . Hypertension    The patient is here for follow up visit.  He has made significant diet changes. Is eating much more fruit and vegetables. He has increased his protein intake and has significantly reduced his intake of carbohydrates and sugar. He is exercising everyday. He has been checking his blood sugars daily. tye have trended downward since the day of his diagnosis. Blood sugars are now running around 90. He states that he feels better than he has in a very long time. Has lost additional two pounds since his last visit. He has no new concerns or complaints today.       Current Medication: Outpatient Encounter Medications as of 05/15/2020  Medication Sig  . Accu-Chek Softclix Lancets lancets Use as instructed once a daily e11.65  . amLODipine-benazepril (LOTREL) 5-20 MG capsule Take 1 capsule by mouth 2 (two) times daily.  . Ascorbic Acid (VITAMIN C) 1000 MG tablet Take 1,000 mg by mouth daily.  . bisoprolol-hydrochlorothiazide (ZIAC) 2.5-6.25 MG tablet Take 1 tablet by mouth daily.  . Calcium Carb-Cholecalciferol (CALTRATE 600+D3 SOFT PO) Take by mouth.  . Garlic 4008 MG CAPS Take by mouth.  Marland Kitchen glucose blood (ACCU-CHEK GUIDE) test strip 1 each by Other route as needed for other. Use as instructed once a daily diag E11.65  . lovastatin (MEVACOR) 40 MG tablet Take 2 tablets (80 mg total) by mouth at bedtime.  . Omega-3 Fatty Acids (FISH OIL) 1000 MG CAPS Take by mouth.  Marland Kitchen omeprazole (PRILOSEC) 40 MG capsule Take 1 capsule (40 mg total) by mouth every morning.   No facility-administered encounter medications on file as of 05/15/2020.    Surgical History: Past Surgical History:  Procedure Laterality Date  . APPENDECTOMY    .  CHOLECYSTECTOMY    . CYST EXCISION     from chest  . EYE SURGERY     cataract  . MEMBRANE PEEL Right 01/02/2016   Procedure: MEMBRANE PEEL;  Surgeon: Milus Height, MD;  Location: [facility] ORS;  Service: Ophthalmology;  Laterality: Right;  . mole surgery     7 surgeries  . NOSE SURGERY    . PARS PLANA VITRECTOMY Right 08/01/2015   Procedure: PARS PLANA VITRECTOMY WITH 25 GAUGE,endolaser,gas exchange;  Surgeon: Milus Height, MD;  Location: [facility] OR[facility];  Service: Ophthalmology;  Laterality: Right;  casette lot #[id number]      28% SF6  . PARS PLANA VITRECTOMY Right 01/02/2016   Procedure: PARS PLANA VITRECTOMY WITH 25 GAUGE, 14 % C3F8 gas exchange;  Surgeon: Milus Height, MD;  Location: [facility] OR[facility];  Service: Ophthalmology;  Laterality: Right;  fluid pack lot # [id number]   exp 08/17/2017  . PTERYGIA     X 2  . 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 Cataract   . Complication of anesthesia    AM DIZZINESS AFTER BOTH EYE SURGERIES  . GERD (gastroesophageal reflux disease)   . Hay fever   . Hypertension   . Seasonal allergies     Family History: Family History  Problem Relation  Age of Onset  . Osteoarthritis Mother     Social History   Socioeconomic History  . Marital status: Married    Spouse name: Not on file  . Number of children: Not on file  . Years of education: Not on file  . Highest education level: Not on file  Occupational History  . Not on file  Tobacco Use  . Smoking status: Current Some Day Smoker    Packs/day: 1.50    Types: Cigarettes  . Smokeless tobacco: Never Used  Substance and Sexual Activity  . Alcohol use: Not Currently  . Drug use: No  . Sexual activity: Not on file  Other Topics Concern  . Not on file   Social History Narrative  . Not on file   Social Determinants of Health   Financial Resource Strain:   . Difficulty of Paying Living Expenses: Not on file  Food Insecurity:   . Worried About Charity fundraiser in the Last Year: Not on file  . Ran Out of Food in the Last Year: Not on file  Transportation Needs:   . Lack of Transportation (Medical): Not on file  . Lack of Transportation (Non-Medical): Not on file  Physical Activity:   . Days of Exercise per Week: Not on file  . Minutes of Exercise per Session: Not on file  Stress:   . Feeling of Stress : Not on file  Social Connections:   . Frequency of Communication with Friends and Family: Not on file  . Frequency of Social Gatherings with Friends and Family: Not on file  . Attends Religious Services: Not on file  . Active Member of Clubs or Organizations: Not on file  . Attends Archivist Meetings: Not on file  . Marital Status: Not on file  Intimate Partner Violence:   . Fear of Current or Ex-Partner: Not on file  . Emotionally Abused: Not on file  . Physically Abused: Not on file  . Sexually Abused: Not on file      Review of Systems  Constitutional: Positive for fatigue. Negative for activity change, appetite change, chills and unexpected weight change.       He has lost additional 2 pounds since his last visit.   HENT: Negative for congestion, postnasal drip, rhinorrhea, sneezing and sore throat.   Respiratory: Negative for cough, chest tightness, shortness of breath and wheezing.   Cardiovascular: Negative for chest pain and palpitations.       Blood pressure is well managed.   Gastrointestinal: Negative for abdominal pain, constipation, diarrhea, nausea and vomiting.  Endocrine: Negative for cold intolerance, heat intolerance, polydipsia and polyuria.       Blood sugars are doing very well with diet and lifestyle changes alone.   Musculoskeletal: Negative for arthralgias, back pain, joint swelling and  neck pain.  Skin: Negative for rash.  Allergic/Immunologic: Negative for environmental allergies.  Neurological: Negative for dizziness, tremors, numbness and headaches.  Hematological: Negative for adenopathy. Does not bruise/bleed easily.  Psychiatric/Behavioral: Negative for behavioral problems (Depression), sleep disturbance and suicidal ideas. The patient is not nervous/anxious.     Today's Vitals   05/15/20 1556  BP: (!) 143/71  Pulse: 66  Resp: 16  Temp: 97.7 F (36.5 C)  SpO2: 99%  Weight: 184 lb (83.5 kg)  Height: 6' (1.829 m)   Body mass index is 24.95 kg/m.  Physical Exam Vitals and nursing note reviewed.  Constitutional:      General: He is not in acute  distress.    Appearance: Normal appearance. He is well-developed. He is not diaphoretic.  HENT:     Head: Normocephalic and atraumatic.     Mouth/Throat:     Pharynx: No oropharyngeal exudate.  Eyes:     Pupils: Pupils are equal, round, and reactive to light.  Neck:     Thyroid: No thyromegaly.     Vascular: No JVD.     Trachea: No tracheal deviation.  Cardiovascular:     Rate and Rhythm: Normal rate and regular rhythm.     Heart sounds: Normal heart sounds. No murmur heard.  No friction rub. No gallop.   Pulmonary:     Effort: Pulmonary effort is normal. No respiratory distress.     Breath sounds: Normal breath sounds. No wheezing or rales.  Chest:     Chest wall: No tenderness.  Abdominal:     Palpations: Abdomen is soft.  Musculoskeletal:        General: Normal range of motion.     Cervical back: Normal range of motion and neck supple.  Lymphadenopathy:     Cervical: No cervical adenopathy.  Skin:    General: Skin is warm and dry.  Neurological:     Mental Status: He is alert and oriented to person, place, and time.     Cranial Nerves: No cranial nerve deficit.  Psychiatric:        Mood and Affect: Mood normal.        Behavior: Behavior normal.        Thought Content: Thought content normal.         Judgment: Judgment normal.    Assessment/Plan: 1. Borderline diabetes - POCT HgB A1C 5.9 today. Continue to control through diet and physical activity. Monitor blood sugars routinely.   2. Primary hypertension Stable. Continue bp medication as prescribed   3. Mixed hyperlipidemia Continue lovastatin as prescribed   4. Cigarette smoker Stressed importance of not smoking. Patient not ready to quit at this time.   General Counseling: rockie schnoor understanding of the findings of todays visit and agrees with plan of treatment. I have discussed any further diagnostic evaluation that may be needed or ordered today. We also reviewed his medications today. he has been encouraged to call the office with any questions or concerns that should arise related to todays visit.  Diabetes Counseling:  1. Addition of ACE inh/ ARB'S for nephroprotection. Microalbumin is updated  2. Diabetic foot care, prevention of complications. Podiatry consult 3. Exercise and lose weight.  4. Diabetic eye examination, Diabetic eye exam is updated  5. Monitor blood sugar closlely. nutrition counseling.  6. Sign and symptoms of hypoglycemia including shaking sweating,confusion and headaches.  This patient was seen by Leretha Pol FNP Collaboration with Dr Lavera Guise as a part of collaborative care agreement  Orders Placed This Encounter  Procedures  . POCT HgB A1C     Total time spent: 30 Minutes   Time spent includes review of chart, medications, test results, and follow up plan with the patient.      Dr Lavera Guise Internal medicine

## 2020-05-23 DIAGNOSIS — D2261 Melanocytic nevi of right upper limb, including shoulder: Secondary | ICD-10-CM | POA: Diagnosis not present

## 2020-05-23 DIAGNOSIS — Z09 Encounter for follow-up examination after completed treatment for conditions other than malignant neoplasm: Secondary | ICD-10-CM | POA: Diagnosis not present

## 2020-05-23 DIAGNOSIS — D225 Melanocytic nevi of trunk: Secondary | ICD-10-CM | POA: Diagnosis not present

## 2020-05-23 DIAGNOSIS — D2271 Melanocytic nevi of right lower limb, including hip: Secondary | ICD-10-CM | POA: Diagnosis not present

## 2020-05-23 DIAGNOSIS — L570 Actinic keratosis: Secondary | ICD-10-CM | POA: Diagnosis not present

## 2020-05-23 DIAGNOSIS — Z872 Personal history of diseases of the skin and subcutaneous tissue: Secondary | ICD-10-CM | POA: Diagnosis not present

## 2020-05-23 DIAGNOSIS — Z85828 Personal history of other malignant neoplasm of skin: Secondary | ICD-10-CM | POA: Diagnosis not present

## 2020-05-23 DIAGNOSIS — Z85820 Personal history of malignant melanoma of skin: Secondary | ICD-10-CM | POA: Diagnosis not present

## 2020-05-23 DIAGNOSIS — X32XXXA Exposure to sunlight, initial encounter: Secondary | ICD-10-CM | POA: Diagnosis not present

## 2020-06-12 ENCOUNTER — Other Ambulatory Visit: Payer: Self-pay

## 2020-06-12 DIAGNOSIS — E782 Mixed hyperlipidemia: Secondary | ICD-10-CM

## 2020-06-12 DIAGNOSIS — I10 Essential (primary) hypertension: Secondary | ICD-10-CM

## 2020-06-12 MED ORDER — AMLODIPINE BESY-BENAZEPRIL HCL 5-20 MG PO CAPS: 1.0000 | ORAL_CAPSULE | Freq: Two times a day (BID) | ORAL | 3 refills | Status: AC

## 2020-06-12 MED ORDER — LOVASTATIN 40 MG PO TABS: 80.0000 mg | ORAL_TABLET | Freq: Every day | ORAL | 3 refills | Status: AC

## 2020-07-05 ENCOUNTER — Other Ambulatory Visit: Payer: Self-pay

## 2020-07-05 DIAGNOSIS — I10 Essential (primary) hypertension: Secondary | ICD-10-CM

## 2020-07-05 MED ORDER — ACCU-CHEK GUIDE VI STRP: 1.0000 | ORAL_STRIP | 1 refills | Status: DC | PRN

## 2020-07-05 MED ORDER — BISOPROLOL-HYDROCHLOROTHIAZIDE 2.5-6.25 MG PO TABS: 1.0000 | ORAL_TABLET | Freq: Every day | ORAL | 3 refills | Status: AC

## 2020-08-16 ENCOUNTER — Ambulatory Visit: Payer: Medicare Other | Admitting: Nurse Practitioner

## 2020-10-12 ENCOUNTER — Other Ambulatory Visit: Payer: Self-pay

## 2020-10-12 MED ORDER — ACCU-CHEK GUIDE VI STRP: ORAL_STRIP | 1 refills | Status: DC

## 2020-10-12 MED ORDER — ACCU-CHEK GUIDE VI STRP: 1.0000 | ORAL_STRIP | 1 refills | Status: DC | PRN

## 2020-10-12 MED ORDER — ACCU-CHEK SOFTCLIX LANCETS MISC: 1 refills | Status: DC

## 2020-10-12 MED ORDER — ACCU-CHEK SOFTCLIX LANCETS MISC: 1 refills | Status: AC

## 2020-10-16 ENCOUNTER — Telehealth: Payer: Self-pay

## 2020-10-16 ENCOUNTER — Other Ambulatory Visit: Payer: Self-pay

## 2020-10-16 ENCOUNTER — Ambulatory Visit: Payer: Medicare Other | Admitting: Internal Medicine

## 2020-10-16 ENCOUNTER — Encounter: Payer: Self-pay | Admitting: Hospice and Palliative Medicine

## 2020-10-16 VITALS — Resp 16 | Ht 72.0 in

## 2020-10-16 NOTE — Telephone Encounter (Signed)
Patient left without being seen, states he will switch providers, Beth

## 2020-10-17 ENCOUNTER — Other Ambulatory Visit: Payer: Self-pay

## 2020-10-17 MED ORDER — ACCU-CHEK GUIDE VI STRP: ORAL_STRIP | 1 refills | Status: DC

## 2020-12-24 DIAGNOSIS — Z85828 Personal history of other malignant neoplasm of skin: Secondary | ICD-10-CM | POA: Diagnosis not present

## 2020-12-24 DIAGNOSIS — T8189XD Other complications of procedures, not elsewhere classified, subsequent encounter: Secondary | ICD-10-CM | POA: Diagnosis not present

## 2021-01-02 ENCOUNTER — Telehealth: Payer: Self-pay | Admitting: Internal Medicine

## 2021-01-02 NOTE — Progress Notes (Signed)
  Chronic Care Management   Note  01/02/2021 Name: Troy Baldwin MRN: [id number] DOB: 1946-01-09  Troy Baldwin is a 75 y.o. year old male who is a primary care patient of No primary care provider on file.. I reached out to Marina Goodell by phone today in response to a referral sent by [name] Mestre's PCP, No primary care provider on file..   [name] was given information about Chronic Care Management services today including:  1. CCM service includes personalized support from designated clinical staff supervised by his physician, including individualized plan of care and coordination with other care providers 2. 24/7 contact phone numbers for assistance for urgent and routine care needs. 3. Service will only be billed when office clinical staff spend 20 minutes or more in a month to coordinate care. 4. Only one practitioner may furnish and bill the service in a calendar month. 5. The patient may stop CCM services at any time (effective at the end of the month) by phone call to the office staff.   Patient wishes to consider information provided and/or speak with a member of the care team before deciding about enrollment in care management services.   Follow up plan:   [place]

## 2021-03-02 ENCOUNTER — Other Ambulatory Visit: Payer: Self-pay | Admitting: Internal Medicine

## 2021-03-06 DIAGNOSIS — K219 Gastro-esophageal reflux disease without esophagitis: Secondary | ICD-10-CM | POA: Diagnosis not present

## 2021-03-06 DIAGNOSIS — H6123 Impacted cerumen, bilateral: Secondary | ICD-10-CM | POA: Diagnosis not present
